# Patient Record
Sex: Female | Born: 1950 | Race: White | Hispanic: No | Marital: Married | Smoking: Never smoker
Health system: Southern US, Community
[De-identification: ages and names within clinical notes are randomized; demographics above are authoritative.]

## PROBLEM LIST (undated history)

## (undated) ENCOUNTER — Ambulatory Visit: Payer: Medicare Other

## (undated) HISTORY — PX: TONSILLECTOMY: SUR1361

---

## 2005-02-13 ENCOUNTER — Ambulatory Visit: Payer: Self-pay | Admitting: Internal Medicine

## 2006-02-27 ENCOUNTER — Ambulatory Visit: Payer: Self-pay | Admitting: Internal Medicine

## 2006-08-14 DIAGNOSIS — D239 Other benign neoplasm of skin, unspecified: Secondary | ICD-10-CM

## 2006-08-14 HISTORY — DX: Other benign neoplasm of skin, unspecified: D23.9

## 2007-04-03 ENCOUNTER — Ambulatory Visit: Payer: Self-pay | Admitting: Internal Medicine

## 2008-04-07 ENCOUNTER — Ambulatory Visit: Payer: Self-pay | Admitting: Internal Medicine

## 2009-06-07 ENCOUNTER — Ambulatory Visit: Payer: Self-pay | Admitting: Internal Medicine

## 2009-11-14 ENCOUNTER — Ambulatory Visit: Payer: Self-pay | Admitting: Unknown Physician Specialty

## 2011-03-19 ENCOUNTER — Ambulatory Visit: Payer: Self-pay | Admitting: Internal Medicine

## 2012-03-19 ENCOUNTER — Ambulatory Visit: Payer: Self-pay | Admitting: Internal Medicine

## 2013-07-08 ENCOUNTER — Ambulatory Visit: Payer: Self-pay | Admitting: Family Medicine

## 2014-06-18 ENCOUNTER — Other Ambulatory Visit: Payer: Self-pay | Admitting: Family Medicine

## 2014-06-18 DIAGNOSIS — Z1231 Encounter for screening mammogram for malignant neoplasm of breast: Secondary | ICD-10-CM

## 2014-07-27 ENCOUNTER — Ambulatory Visit
Admission: RE | Admit: 2014-07-27 | Discharge: 2014-07-27 | Disposition: A | Discharge status: Home / Self Care | Payer: 59 | Source: Ambulatory Visit | Attending: Family Medicine | Admitting: Family Medicine

## 2014-07-27 DIAGNOSIS — Z1231 Encounter for screening mammogram for malignant neoplasm of breast: Secondary | ICD-10-CM | POA: Diagnosis not present

## 2015-09-15 ENCOUNTER — Other Ambulatory Visit: Payer: Self-pay | Admitting: Family Medicine

## 2015-09-15 DIAGNOSIS — Z1231 Encounter for screening mammogram for malignant neoplasm of breast: Secondary | ICD-10-CM

## 2015-10-03 ENCOUNTER — Ambulatory Visit
Admission: RE | Admit: 2015-10-03 | Discharge: 2015-10-03 | Disposition: A | Discharge status: Home / Self Care | Payer: No Typology Code available for payment source | Source: Ambulatory Visit | Attending: Family Medicine | Admitting: Family Medicine

## 2015-10-03 DIAGNOSIS — R928 Other abnormal and inconclusive findings on diagnostic imaging of breast: Secondary | ICD-10-CM | POA: Insufficient documentation

## 2015-10-03 DIAGNOSIS — Z1231 Encounter for screening mammogram for malignant neoplasm of breast: Secondary | ICD-10-CM | POA: Insufficient documentation

## 2015-10-04 ENCOUNTER — Other Ambulatory Visit: Payer: Self-pay | Admitting: Family Medicine

## 2015-10-04 DIAGNOSIS — N6489 Other specified disorders of breast: Secondary | ICD-10-CM

## 2015-10-10 ENCOUNTER — Ambulatory Visit
Admission: RE | Admit: 2015-10-10 | Discharge: 2015-10-10 | Disposition: A | Discharge status: Home / Self Care | Payer: No Typology Code available for payment source | Source: Ambulatory Visit | Attending: Family Medicine | Admitting: Family Medicine

## 2015-10-10 DIAGNOSIS — N6489 Other specified disorders of breast: Secondary | ICD-10-CM | POA: Insufficient documentation

## 2017-05-14 ENCOUNTER — Other Ambulatory Visit: Payer: Self-pay | Admitting: Family Medicine

## 2017-05-14 DIAGNOSIS — Z1231 Encounter for screening mammogram for malignant neoplasm of breast: Secondary | ICD-10-CM

## 2017-05-27 ENCOUNTER — Ambulatory Visit
Admission: RE | Admit: 2017-05-27 | Discharge: 2017-05-27 | Disposition: A | Discharge status: Home / Self Care | Payer: Medicare Other | Source: Ambulatory Visit | Attending: Family Medicine | Admitting: Family Medicine

## 2017-05-27 ENCOUNTER — Other Ambulatory Visit: Payer: Self-pay | Admitting: Family Medicine

## 2017-05-27 DIAGNOSIS — Z1231 Encounter for screening mammogram for malignant neoplasm of breast: Secondary | ICD-10-CM | POA: Diagnosis present

## 2017-06-13 IMAGING — US US BREAST*L* LIMITED INC AXILLA
1 series · 4 of 4 positions shown · non-contrast
Comparison: Previous exams including recent screening mammogram
dated 10/03/2015.

CLINICAL DATA: Patient returns today to evaluate a possible left
breast asymmetry identified on recent screening mammogram.

EXAM:
2D DIGITAL DIAGNOSTIC LEFT MAMMOGRAM WITH CAD AND ADJUNCT TOMO
ULTRASOUND LEFT BREAST

[Series 1: us breast*left* limited inc axilla · 0.07mm/px · 4 of 4 slices shown]
[im 1/4]
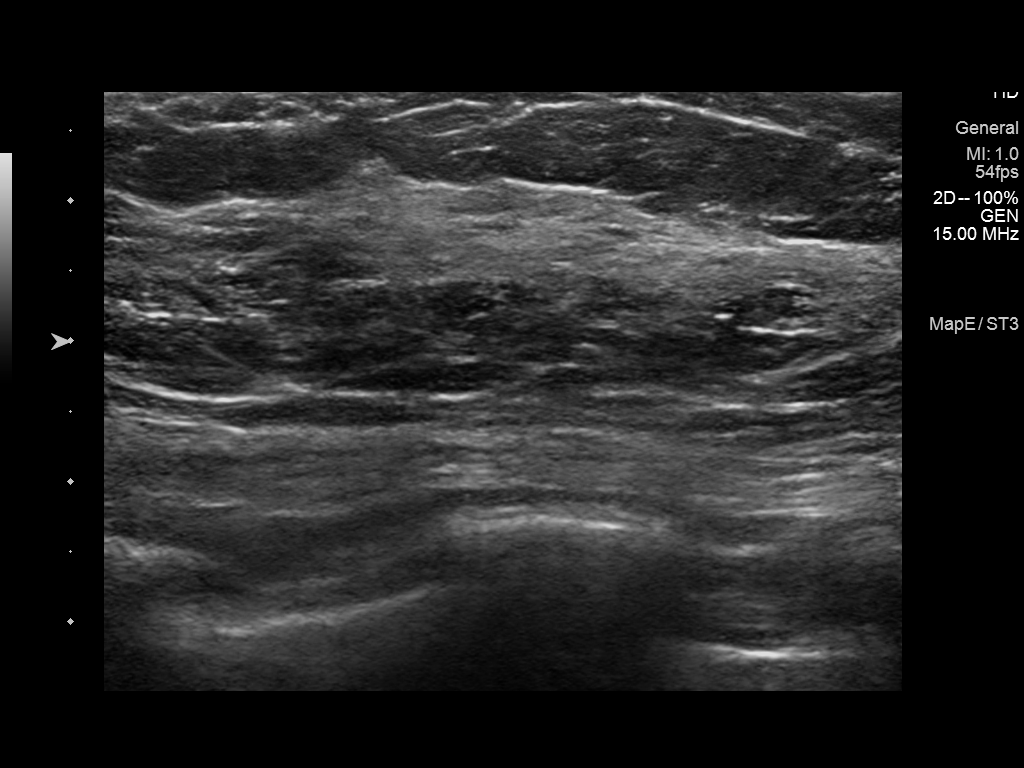
[im 2/4]
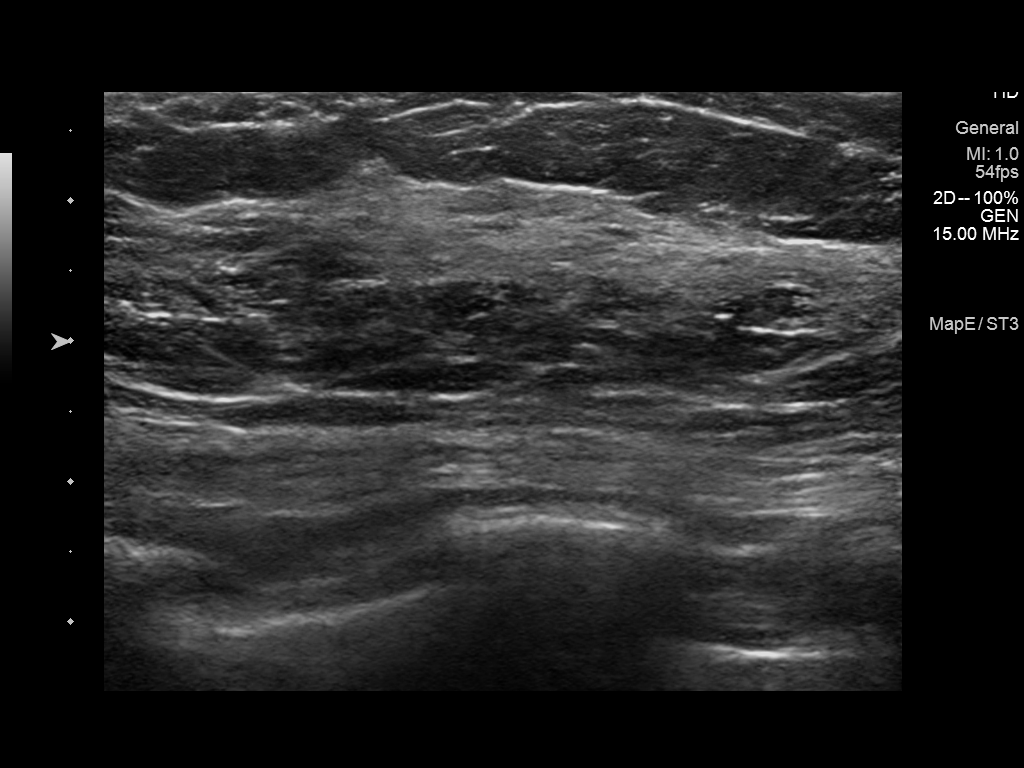
[im 3/4]
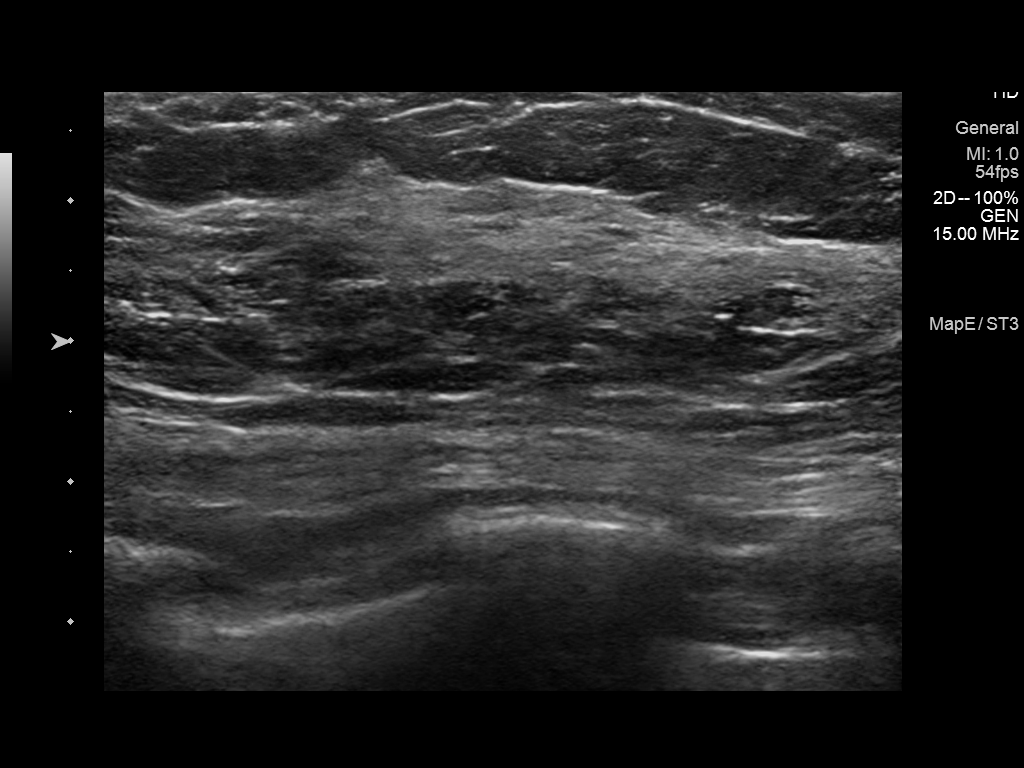
[im 4/4]
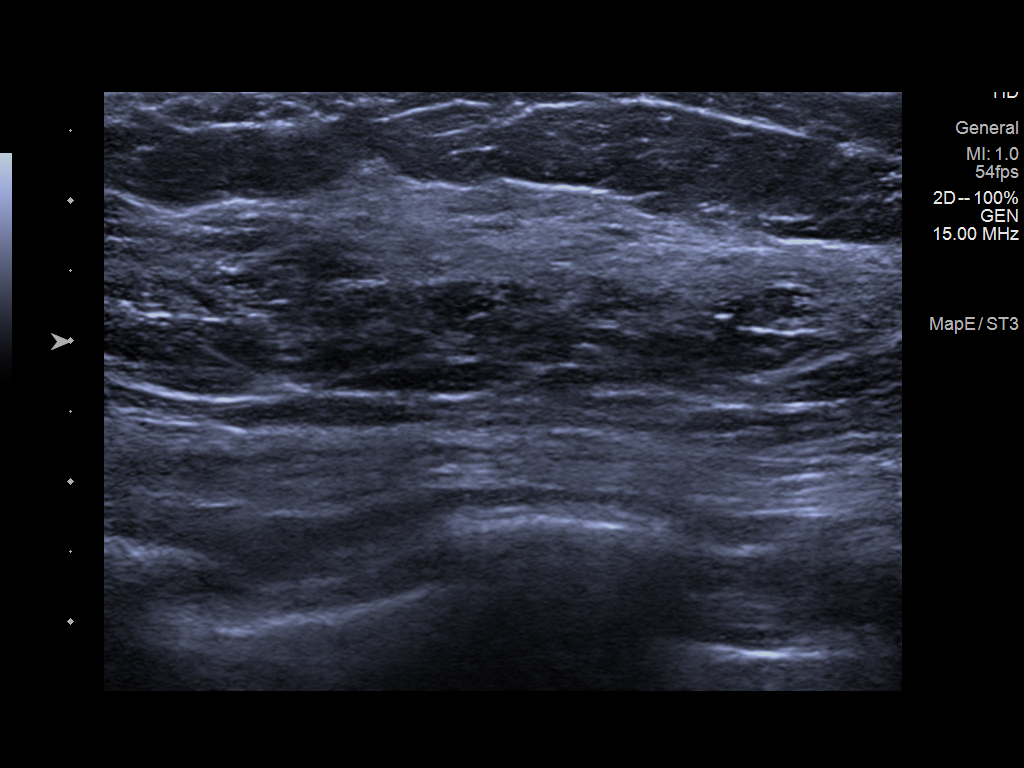

[4 of 4 positions shown; findings below may reference images not displayed]

ACR Breast Density Category b: There are scattered areas of
fibroglandular density.
FINDINGS: On today's additional views with spot compression and 3D
tomosynthesis, the questioned asymmetry in the upper left breast is
most suggestive of superimposition of normal dense fibroglandular
tissues, particularly on the true lateral view. There are no
circumscribed masses, suspicious calcifications or secondary signs
of malignancy identified within the left breast on today's exam.

Mammographic images were processed with CAD.

On physical exam, there is no palpable abnormality identified within
the upper left breast.

Targeted ultrasound is performed, evaluating the upper left breast
from [DATE] to [DATE] axes, showing only normal fibroglandular tissues
and fat lobules throughout. No suspicious solid or cystic masses are
seen by ultrasound. There is a ridge of normal dense fibroglandular
tissue within the left breast at the 1 o'clock axis, corresponding
to the mammographic appearance.
IMPRESSION: No evidence of malignancy within the left breast. Ridge of normal
dense fibroglandular tissue at the 1 o'clock axis of the left breast
corresponding to the mammographic appearance.

Patient may return to routine annual bilateral screening mammogram
schedule.

RECOMMENDATION:
Screening mammogram in one year.(Code:NP-5-5EC)

I have discussed the findings and recommendations with the patient.
Results were also provided in writing at the conclusion of the
visit. If applicable, a reminder letter will be sent to the patient
regarding the next appointment.

BI-RADS CATEGORY  1: Negative.

## 2019-06-02 ENCOUNTER — Other Ambulatory Visit: Payer: Self-pay

## 2019-06-02 ENCOUNTER — Ambulatory Visit (INDEPENDENT_AMBULATORY_CARE_PROVIDER_SITE_OTHER): Payer: Medicare Other | Admitting: Dermatology

## 2019-06-02 DIAGNOSIS — L578 Other skin changes due to chronic exposure to nonionizing radiation: Secondary | ICD-10-CM | POA: Diagnosis not present

## 2019-06-02 DIAGNOSIS — Z1283 Encounter for screening for malignant neoplasm of skin: Secondary | ICD-10-CM

## 2019-06-02 DIAGNOSIS — Z86018 Personal history of other benign neoplasm: Secondary | ICD-10-CM

## 2019-06-02 DIAGNOSIS — D1801 Hemangioma of skin and subcutaneous tissue: Secondary | ICD-10-CM

## 2019-06-02 DIAGNOSIS — L821 Other seborrheic keratosis: Secondary | ICD-10-CM | POA: Diagnosis not present

## 2019-06-02 DIAGNOSIS — D229 Melanocytic nevi, unspecified: Secondary | ICD-10-CM

## 2019-06-02 DIAGNOSIS — L738 Other specified follicular disorders: Secondary | ICD-10-CM | POA: Diagnosis not present

## 2019-06-02 DIAGNOSIS — D225 Melanocytic nevi of trunk: Secondary | ICD-10-CM

## 2019-06-02 DIAGNOSIS — L988 Other specified disorders of the skin and subcutaneous tissue: Secondary | ICD-10-CM

## 2019-06-02 NOTE — Progress Notes (Signed)
   Follow-Up Visit   Subjective  Mary Bennett is a 69 y.o. female who presents for the following: Annual Exam (Spots, some may have changed).  Nothing bothering her.  No concerns.  The following portions of the chart were reviewed this encounter and updated as appropriate:    Review of Systems: No other skin or systemic complaints.  Objective  Well appearing patient in no apparent distress; mood and affect are within normal limits.  A full examination was performed including scalp, head, eyes, ears, nose, lips, neck, chest, axillae, abdomen, back, buttocks, bilateral upper extremities, bilateral lower extremities, hands, feet, fingers, toes, fingernails, and toenails. All findings within normal limits unless otherwise noted below.  Objective  face: Rhytides and volume loss.   Objective  L lower medial leg, R mid back: Scar with no evidence of recurrence.   Objective  L chest: 6mm medium dark brown papule  Objective  Left Abdomen (side) - Lower: Stuck-on, waxy, tan-brown papules and plaques -- Discussed benign etiology and prognosis.   Assessment & Plan    Skin cancer screening performed today. Actinic Damage - diffuse scaly erythematous macules with underlying dyspigmentation - Recommend daily broad spectrum sunscreen SPF 30+ to sun-exposed areas, reapply every 2 hours as needed.  - Call for new or changing lesions. Melanocytic Nevi - Tan-brown and/or pink-flesh-colored symmetric macules and papules - Benign appearing on exam today - Observation - Call clinic for new or changing moles - Recommend daily use of broad spectrum spf 30+ sunscreen to sun-exposed areas.  Sebaceous Hyperplasia - Small yellow papules with a central dell - Benign - Observe Hemangiomas - Red papules - Discussed benign nature - Observe - Call for any changes  Elastosis of skin face  Discussed Botox vs Fillers vs Laser vs Retinoids  Recommend daily broad spectrum sunscreen SPF 30+ to  sun-exposed areas, reapply every 2 hours as needed. Call for new or changing lesions.   History of dysplastic nevus L lower medial leg, R mid back  No evidence of recurrence, call clinic for new or changing lesions.   Nevus L chest  Benign-appearing.  Observation.  Call clinic for new or changing moles.  Recommend daily use of broad spectrum spf 30+ sunscreen to sun-exposed areas.    Seborrheic keratosis Left Abdomen (side) - Lower  Benign, observe.    Return in about 1 year (around 06/01/2020) for TBSE.   Graciella Belton, RMA, am acting as scribe for Brendolyn Patty, MD .

## 2019-06-02 NOTE — Patient Instructions (Signed)
Recommend daily broad spectrum sunscreen SPF 30+ to sun-exposed areas, reapply every 2 hours as needed. Call for new or changing lesions.  

## 2019-09-09 ENCOUNTER — Other Ambulatory Visit: Payer: Self-pay | Admitting: Family Medicine

## 2020-01-25 ENCOUNTER — Other Ambulatory Visit: Payer: Self-pay | Admitting: Family Medicine

## 2020-01-25 DIAGNOSIS — Z1231 Encounter for screening mammogram for malignant neoplasm of breast: Secondary | ICD-10-CM

## 2020-03-18 ENCOUNTER — Other Ambulatory Visit: Payer: Self-pay

## 2020-03-18 ENCOUNTER — Ambulatory Visit
Admission: RE | Admit: 2020-03-18 | Discharge: 2020-03-18 | Disposition: A | Discharge status: Home / Self Care | Payer: Medicare Other | Source: Ambulatory Visit | Attending: Family Medicine | Admitting: Family Medicine

## 2020-03-18 DIAGNOSIS — Z1231 Encounter for screening mammogram for malignant neoplasm of breast: Secondary | ICD-10-CM | POA: Diagnosis not present

## 2020-06-06 ENCOUNTER — Other Ambulatory Visit: Payer: Self-pay

## 2020-06-06 ENCOUNTER — Encounter: Payer: Self-pay | Admitting: Dermatology

## 2020-06-06 ENCOUNTER — Ambulatory Visit (INDEPENDENT_AMBULATORY_CARE_PROVIDER_SITE_OTHER): Payer: Medicare Other | Admitting: Dermatology

## 2020-06-06 DIAGNOSIS — Z86018 Personal history of other benign neoplasm: Secondary | ICD-10-CM | POA: Diagnosis not present

## 2020-06-06 DIAGNOSIS — D229 Melanocytic nevi, unspecified: Secondary | ICD-10-CM

## 2020-06-06 DIAGNOSIS — Z1283 Encounter for screening for malignant neoplasm of skin: Secondary | ICD-10-CM | POA: Diagnosis not present

## 2020-06-06 DIAGNOSIS — D225 Melanocytic nevi of trunk: Secondary | ICD-10-CM

## 2020-06-06 DIAGNOSIS — L814 Other melanin hyperpigmentation: Secondary | ICD-10-CM

## 2020-06-06 DIAGNOSIS — L578 Other skin changes due to chronic exposure to nonionizing radiation: Secondary | ICD-10-CM | POA: Diagnosis not present

## 2020-06-06 DIAGNOSIS — D2239 Melanocytic nevi of other parts of face: Secondary | ICD-10-CM

## 2020-06-06 DIAGNOSIS — D1800 Hemangioma unspecified site: Secondary | ICD-10-CM

## 2020-06-06 DIAGNOSIS — L821 Other seborrheic keratosis: Secondary | ICD-10-CM

## 2020-06-06 NOTE — Progress Notes (Signed)
   Follow-Up Visit   Subjective  Mary Bennett is a 70 y.o. female who presents for the following: Annual Exam (Patient here for TBSE. She has a new growth on her left lower abdomen. No symptoms noted. History of dysplastic nevi of the left lower medial leg, right mid back, and left mid back. ).   The following portions of the chart were reviewed this encounter and updated as appropriate:       Review of Systems:  No other skin or systemic complaints except as noted in HPI or Assessment and Plan.  Objective  Well appearing patient in no apparent distress; mood and affect are within normal limits.  A full examination was performed including scalp, head, eyes, ears, nose, lips, neck, chest, axillae, abdomen, back, buttocks, bilateral upper extremities, bilateral lower extremities, hands, feet, fingers, toes, fingernails, and toenails. All findings within normal limits unless otherwise noted below.  Objective  Left chest: 6.74mm fleshy medium dark brown papule  Right Temple: 3.16mm brown macule, darker central   Assessment & Plan   Skin cancer screening performed today.  Actinic Damage - chronic, secondary to cumulative UV radiation exposure/sun exposure over time - diffuse scaly erythematous macules with underlying dyspigmentation - Recommend daily broad spectrum sunscreen SPF 30+ to sun-exposed areas, reapply every 2 hours as needed.  - Recommend staying in the shade or wearing long sleeves, sun glasses (UVA+UVB protection) and wide brim hats (4-inch brim around the entire circumference of the hat). - Call for new or changing lesions.  Lentigines - Scattered tan macules - Due to sun exposure - Benign-appering, observe - Recommend daily broad spectrum sunscreen SPF 30+ to sun-exposed areas, reapply every 2 hours as needed. - Call for any changes  Melanocytic Nevi - Tan-brown and/or pink-flesh-colored symmetric macules and papules - Benign appearing on exam today -  Observation - Call clinic for new or changing moles - Recommend daily use of broad spectrum spf 30+ sunscreen to sun-exposed areas.   Seborrheic Keratoses - Stuck-on, waxy, tan-brown papules and/or plaques, including left lower abdomen  - Benign-appearing - Discussed benign etiology and prognosis. - Observe - Call for any changes  History of Dysplastic Nevi - No evidence of recurrence today - Recommend regular full body skin exams - Recommend daily broad spectrum sunscreen SPF 30+ to sun-exposed areas, reapply every 2 hours as needed.  - Call if any new or changing lesions are noted between office visits  Hemangiomas - Red papules - Discussed benign nature - Observe - Call for any changes  Nevus (2) Left chest; Right Temple  Benign-appearing.  Observation.  Call clinic for new or changing moles.  Recommend daily use of broad spectrum spf 30+ sunscreen to sun-exposed areas.    Return in about 1 year (around 06/06/2021) for TBSE.   IJamesetta Orleans, CMA, am acting as scribe for Brendolyn Patty, MD .  Documentation: I have reviewed the above documentation for accuracy and completeness, and I agree with the above.  Brendolyn Patty MD

## 2020-06-06 NOTE — Patient Instructions (Addendum)
If you have any questions or concerns for your doctor, please call our main line at [phone] and press option 4 to reach your doctor's medical assistant. If no one answers, please leave a voicemail as directed and we will return your call as soon as possible. Messages left after 4 pm will be answered the following business day.   You may also send [place] a message via [place]. We typically respond to MyChart messages within 1-2 business days.  For prescription refills, please ask your pharmacy to contact our office. Our fax number is [phone].  If you have an urgent issue when the clinic is closed that cannot wait until the next business day, you can page your doctor at the number below.    Please note that while we do our best to be available for urgent issues outside of office hours, we are not available 24/7.   If you have an urgent issue and are unable to reach [place], you may choose to seek medical care at your doctor's office, retail clinic, urgent care center, or emergency room.  If you have a medical emergency, please immediately call 911 or go to the emergency department.  Pager Numbers  - Dr. Nehemiah Massed: [phone]  - Dr. Laurence Ferrari: [phone]  - Dr. Nicole Kindred: [phone]  In the event of inclement weather, please call our main line at [phone] for an update on the status of any delays or closures.  Dermatology Medication Tips: Please keep the boxes that topical medications come in in order to help keep track of the instructions about where and how to use these. Pharmacies typically print the medication instructions only on the boxes and not directly on the medication tubes.   If your medication is too expensive, please contact our office at [phone] option 4 or send [place] a message through [place].   We are unable to tell what your co-pay for medications will be in advance as this is different depending on your insurance coverage. However, we may be able to find a substitute  medication at lower cost or fill out paperwork to get insurance to cover a needed medication.   If a prior authorization is required to get your medication covered by your insurance company, please allow [place] 1-2 business days to complete this process.  Drug prices often vary depending on where the prescription is filled and some pharmacies may offer cheaper prices.  The website www.goodrx.com contains coupons for medications through different pharmacies. The prices here do not account for what the cost may be with help from insurance (it may be cheaper with your insurance), but the website can give you the price if you did not use any insurance.  - You can print the associated coupon and take it with your prescription to the pharmacy.  - You may also stop by our office during regular business hours and pick up a GoodRx coupon card.  - If you need your prescription sent electronically to a different pharmacy, notify our office through [facility] or by phone at [phone] option 4.   Melanoma ABCDEs  Melanoma is the most dangerous type of skin cancer, and is the leading cause of death from skin disease.  You are more likely to develop melanoma if you:  Have light-colored skin, light-colored eyes, or red or blond hair  Spend a lot of time in the sun  Tan regularly, either outdoors or in a tanning bed  Have had blistering sunburns, especially during childhood  Have a close  family member who has had a melanoma  Have atypical moles or large birthmarks  Early detection of melanoma is key since treatment is typically straightforward and cure rates are extremely high if we catch it early.   The first sign of melanoma is often a change in a mole or a new dark spot.  The ABCDE system is a way of remembering the signs of melanoma.  A for asymmetry:  The two halves do not match. B for border:  The edges of the growth are irregular. C for color:  A mixture of colors are present instead  of an even brown color. D for diameter:  Melanomas are usually (but not always) greater than 36mm - the size of a pencil eraser. E for evolution:  The spot keeps changing in size, shape, and color.  Please check your skin once per month between visits. You can use a small mirror in front and a large mirror behind you to keep an eye on the back side or your body.   If you see any new or changing lesions before your next follow-up, please call to schedule a visit.  Please continue daily skin protection including broad spectrum sunscreen SPF 30+ to sun-exposed areas, reapplying every 2 hours as needed when you're outdoors.   Staying in the shade or wearing long sleeves, sun glasses (UVA+UVB protection) and wide brim hats (4-inch brim around the entire circumference of the hat) are also recommended for sun protection.

## 2021-04-18 ENCOUNTER — Other Ambulatory Visit: Payer: Self-pay | Admitting: Family Medicine

## 2021-04-18 DIAGNOSIS — Z1231 Encounter for screening mammogram for malignant neoplasm of breast: Secondary | ICD-10-CM

## 2021-04-21 ENCOUNTER — Ambulatory Visit
Admission: RE | Admit: 2021-04-21 | Discharge: 2021-04-21 | Disposition: A | Discharge status: Home / Self Care | Payer: Medicare Other | Source: Ambulatory Visit | Attending: Family Medicine | Admitting: Family Medicine

## 2021-04-21 ENCOUNTER — Other Ambulatory Visit: Payer: Self-pay

## 2021-04-21 DIAGNOSIS — Z1231 Encounter for screening mammogram for malignant neoplasm of breast: Secondary | ICD-10-CM | POA: Diagnosis present

## 2021-06-19 ENCOUNTER — Ambulatory Visit (INDEPENDENT_AMBULATORY_CARE_PROVIDER_SITE_OTHER): Payer: Medicare Other | Admitting: Dermatology

## 2021-06-19 DIAGNOSIS — D225 Melanocytic nevi of trunk: Secondary | ICD-10-CM | POA: Diagnosis not present

## 2021-06-19 DIAGNOSIS — D229 Melanocytic nevi, unspecified: Secondary | ICD-10-CM

## 2021-06-19 DIAGNOSIS — Z1283 Encounter for screening for malignant neoplasm of skin: Secondary | ICD-10-CM

## 2021-06-19 DIAGNOSIS — L578 Other skin changes due to chronic exposure to nonionizing radiation: Secondary | ICD-10-CM

## 2021-06-19 DIAGNOSIS — D1800 Hemangioma unspecified site: Secondary | ICD-10-CM

## 2021-06-19 DIAGNOSIS — D2239 Melanocytic nevi of other parts of face: Secondary | ICD-10-CM | POA: Diagnosis not present

## 2021-06-19 DIAGNOSIS — L821 Other seborrheic keratosis: Secondary | ICD-10-CM

## 2021-06-19 DIAGNOSIS — L304 Erythema intertrigo: Secondary | ICD-10-CM

## 2021-06-19 DIAGNOSIS — Z86018 Personal history of other benign neoplasm: Secondary | ICD-10-CM

## 2021-06-19 DIAGNOSIS — L814 Other melanin hyperpigmentation: Secondary | ICD-10-CM

## 2021-06-19 MED ORDER — KETOCONAZOLE 2 % EX CREA: 1.0000 "application " | TOPICAL_CREAM | Freq: Two times a day (BID) | CUTANEOUS | 2 refills | Status: DC

## 2021-06-19 NOTE — Patient Instructions (Addendum)
Start over the [employer] powder apply to affected skin after shower/bath ?Start over the counter plain Vaseline apply to affected skin at bedtime  ? ? ?If You Need Anything After Your Visit ? ?If you have any questions or concerns for your doctor, please call our main line at [phone] and press option 4 to reach your doctor's medical assistant. If no one answers, please leave a voicemail as directed and we will return your call as soon as possible. Messages left after 4 pm will be answered the following business day.  ? ?You may also send [place] a message via MyChart. We typically respond to MyChart messages within 1-2 business days. ? ?For prescription refills, please ask your pharmacy to contact our office. Our fax number is [phone]. ? ?If you have an urgent issue when the clinic is closed that cannot wait until the next business day, you can page your doctor at the number below.   ? ?Please note that while we do our best to be available for urgent issues outside of office hours, we are not available 24/7.  ? ?If you have an urgent issue and are unable to reach [place], you may choose to seek medical care at your doctor's office, retail clinic, urgent care center, or emergency room. ? ?If you have a medical emergency, please immediately call 911 or go to the emergency department. ? ?Pager Numbers ? ?- Dr. Nehemiah Massed: [phone] ? ?- Dr. Laurence Ferrari: [phone] ? ?- Dr. Nicole Kindred: [phone] ? ?In the event of inclement weather, please call our main line at [phone] for an update on the status of any delays or closures. ? ?Dermatology Medication Tips: ?Please keep the boxes that topical medications come in in order to help keep track of the instructions about where and how to use these. Pharmacies typically print the medication instructions only on the boxes and not directly on the medication tubes.  ? ?If your medication is too expensive, please contact our office at [phone] option 4 or send [place] a  message through [place].  ? ?We are unable to tell what your co-pay for medications will be in advance as this is different depending on your insurance coverage. However, we may be able to find a substitute medication at lower cost or fill out paperwork to get insurance to cover a needed medication.  ? ?If a prior authorization is required to get your medication covered by your insurance company, please allow [place] 1-2 business days to complete this process. ? ?Drug prices often vary depending on where the prescription is filled and some pharmacies may offer cheaper prices. ? ?The website www.goodrx.com contains coupons for medications through different pharmacies. The prices here do not account for what the cost may be with help from insurance (it may be cheaper with your insurance), but the website can give you the price if you did not use any insurance.  ?- You can print the associated coupon and take it with your prescription to the pharmacy.  ?- You may also stop by our office during regular business hours and pick up a GoodRx coupon card.  ?- If you need your prescription sent electronically to a different pharmacy, notify our office through [facility] or by phone at [phone] option 4. ? ? ? ? ?Si Usted Necesita Algo Despu?s de Su Visita ? ?Tambi?n puede enviarnos un mensaje a trav?s de MyChart. Por lo general respondemos a los mensajes de MyChart en el transcurso de 1 a 2 d?as h?biles. ? ?  Para renovar recetas, por favor pida a su farmacia que se ponga en contacto con nuestra oficina. Nuestro n?mero de fax es el [phone]. ? ?Si tiene un asunto urgente cuando la cl?nica est? cerrada y que no puede esperar hasta el siguiente d?a h?bil, puede llamar/localizar a su doctor(a) al n?mero que aparece a continuaci?n.  ? ?Por favor, tenga en cuenta que aunque hacemos todo lo posible para estar disponibles para asuntos urgentes fuera del horario de oficina, no estamos disponibles las 24 horas del d?a, los 7  d?as de la semana.  ? ?Si tiene un problema urgente y no puede comunicarse con nosotros, puede optar por buscar atenci?n m?dica  en el consultorio de su doctor(a), en una cl?nica privada, en un centro de atenci?n urgente o en una sala de emergencias. ? ?Si tiene Engineer, maintenance (IT) m?dica, por favor llame inmediatamente al 911 o vaya a la sala de emergencias. ? ?N?meros de b?per ? ?- Dr. Nehemiah Massed: [phone] ? ?- Dra. Moye: [phone] ? ?- Dra. Nicole Kindred: [phone] ? ?En caso de inclemencias del tiempo, por favor llame a nuestra l?nea principal al [phone] para una actualizaci?n sobre el estado de cualquier retraso o cierre. ? ?Consejos para la medicaci?n en dermatolog?a: ?Por favor, guarde las cajas en las que vienen los medicamentos de uso t?pico para ayudarle a seguir las instrucciones sobre d?nde y c?mo usarlos. Las farmacias generalmente imprimen las instrucciones del medicamento s?lo en las cajas y no directamente en los tubos del [place].  ? ?Si su medicamento es muy caro, por favor, p?ngase en contacto con Zigmund Daniel llamando al [phone] y presione la opci?n 4 o env?enos un mensaje a trav?s de MyChart.  ? ?No podemos decirle cu?l ser? su copago por los medicamentos por adelantado ya que esto es diferente dependiendo de la cobertura de su seguro. Sin embargo, es posible que podamos encontrar un medicamento sustituto a Electrical engineer un formulario para que el seguro cubra el medicamento que se considera necesario.  ? ?Si se requiere Ardelia Mems autorizaci?n previa para que su compa??a de seguros [place] su medicamento, por favor perm?tanos de 1 a 2 d?as h?biles para completar este proceso. ? ?Los precios de los medicamentos var?an con frecuencia dependiendo del Environmental consultant de d?nde se surte la receta y alguna farmacias pueden ofrecer precios m?s baratos. ? ?El sitio web www.goodrx.com tiene cupones para medicamentos de Airline pilot. Los precios aqu? no tienen en cuenta lo que podr?a costar con  la ayuda del seguro (puede ser m?s barato con su seguro), pero el sitio web puede darle el precio si no utiliz? ning?n seguro.  ?- Puede imprimir el cup?n correspondiente y llevarlo con su receta a la farmacia.  ?- Tambi?n puede pasar por nuestra oficina durante el horario de atenci?n regular y recoger una tarjeta de cupones de GoodRx.  ?- Si necesita que su receta se env?e electr?nicamente a Chiropodist, informe a nuestra oficina a trav?s de MyChart de [place] o por tel?fono llamando al [phone] y presione la opci?n 4.  ?

## 2021-06-19 NOTE — Progress Notes (Signed)
? ?Follow-Up Visit ?  ?Subjective  ?Mary Bennett is a 71 y.o. female who presents for the following: Annual Exam. Yearly mole check, hx of Dysplastic nevus. The patient presents for Total-Body Skin Exam (TBSE) for skin cancer screening and mole check.  The patient has spots, moles and lesions to be evaluated, some may be new or changing and the patient has concerns that these could be cancer. Nothing is bothering her or changing that she knows of.  She had a perirectal rash that has improved on ketoconazole cream. ? ? ? ?The following portions of the chart were reviewed this encounter and updated as appropriate:  ?  ?  ? ?Review of Systems:  No other skin or systemic complaints except as noted in HPI or Assessment and Plan. ? ?Objective  ?Well appearing patient in no apparent distress; mood and affect are within normal limits. ? ?A full examination was performed including scalp, head, eyes, ears, nose, lips, neck, chest, axillae, abdomen, back, buttocks, bilateral upper extremities, bilateral lower extremities, hands, feet, fingers, toes, fingernails, and toenails. All findings within normal limits unless otherwise noted below. ? ?perirectal ?Light pink xerotic patch of the perirectal area R>L ? ?left chest, left mid sternum, left spinal mid back, right temple ?Left chest: ?6.13m fleshy medium dark brown papule ?  ?Right Temple: ?3.017mbrown macule, darker central ? ?Left spinal mid back ?4.0 x 3.99m16mrown macule with a red macule adjacent, slightly irregular border ? ?Left sternum  ?3.5mm36meckled brown macule, no changes per pt ? ? ? 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 Improving but not to goal.  ? ?Cont Ketoconazole 2% cream apply to affected skin bid prn flares  ?Start over the counter plain Vaseline apply to affected skin at bedtime  ?Start over the counter Ze[employer] AF powder after  shower daily in AM ? ?Intertrigo is a chronic recurrent rash that occurs in skin fold areas that may be associated with friction; heat; moisture; yeast; fungus; and bacteria.  It is exacerbated by increased movement / activity; sweating; and higher atmospheric temperature.  ? ? ? ?Related Medications ?ketoconazole (NIZORAL) 2 % cream ?Apply 1 application. topically 2 (two) times daily. Apply to affected skin prn flares ? ?Nevus (4) ?right temple; left chest; left mid sternum; left spinal mid back ? ?Benign-appearing.  Observation.  Call clinic for new or changing moles.  Recommend daily use of broad spectrum spf 30+ sunscreen to sun-exposed areas.   ? ? ?Lentigines ?- Scattered tan macules ?- Due to sun exposure ?- Benign-appearing, observe ?- Recommend daily broad spectrum sunscreen SPF 30+ to sun-exposed areas, reapply every 2 hours as needed. ?- Call for any changes ? ?Seborrheic Keratoses ?- Stuck-on, waxy, tan-brown papules and/or plaques  ?- Benign-appearing ?- Discussed benign etiology and prognosis. ?- Observe ?- Call for any changes ? ?Melanocytic Nevi ?- Tan-brown and/or pink-flesh-colored symmetric macules and papules ?- Benign appearing on exam today ?- Observation ?- Call clinic for new or changing moles ?- Recommend daily use of broad spectrum spf 30+ sunscreen to sun-exposed areas.  ? ?Hemangiomas ?- Red papules ?- Discussed benign nature ?- Observe ?- Call for any changes ? ?Actinic Damage ?- Chronic condition, secondary to cumulative UV/sun exposure ?- diffuse scaly erythematous macules with underlying dyspigmentation ?- Recommend daily broad spectrum sunscreen SPF 30+ to sun-exposed areas, reapply every 2 hours as needed.  ?- Staying in  the shade or wearing long sleeves, sun glasses (UVA+UVB protection) and wide brim hats (4-inch brim around the entire circumference of the hat) are also recommended for sun protection.  ?- Call for new or changing lesions. ? ?History of Dysplastic Nevi ?- No  evidence of recurrence today ?- Recommend regular full body skin exams ?- Recommend daily broad spectrum sunscreen SPF 30+ to sun-exposed areas, reapply every 2 hours as needed.  ?- Call if any new or changing lesions are noted between office visits  ? ?Skin cancer screening performed today.  ? ? ?Return in about 1 year (around 06/20/2022) for TBSE, hx of Dysplastic nevus . ? ?I, Marye Round, CMA, am acting as scribe for Brendolyn Patty, MD .  ? ?Documentation: I have reviewed the above documentation for accuracy and completeness, and I agree with the above. ? ?Brendolyn Patty MD  ?

## 2021-11-07 ENCOUNTER — Encounter
Admission: RE | Disposition: A | Discharge status: Home / Self Care | Payer: Self-pay | Source: Ambulatory Visit | Attending: Gastroenterology

## 2021-11-07 ENCOUNTER — Ambulatory Visit: Payer: Medicare Other | Admitting: Anesthesiology

## 2021-11-07 ENCOUNTER — Encounter: Payer: Self-pay | Admitting: *Deleted

## 2021-11-07 ENCOUNTER — Other Ambulatory Visit: Payer: Self-pay

## 2021-11-07 ENCOUNTER — Ambulatory Visit
Admission: RE | Admit: 2021-11-07 | Discharge: 2021-11-07 | Disposition: A | Discharge status: Home / Self Care | Payer: Medicare Other | Source: Ambulatory Visit | Attending: Gastroenterology | Admitting: Gastroenterology

## 2021-11-07 DIAGNOSIS — Z1211 Encounter for screening for malignant neoplasm of colon: Secondary | ICD-10-CM | POA: Insufficient documentation

## 2021-11-07 DIAGNOSIS — K5730 Diverticulosis of large intestine without perforation or abscess without bleeding: Secondary | ICD-10-CM | POA: Diagnosis not present

## 2021-11-07 DIAGNOSIS — K640 First degree hemorrhoids: Secondary | ICD-10-CM | POA: Insufficient documentation

## 2021-11-07 HISTORY — PX: COLONOSCOPY WITH PROPOFOL: SHX5780

## 2021-11-07 SURGERY — COLONOSCOPY WITH PROPOFOL
Anesthesia: General

## 2021-11-07 MED ORDER — LIDOCAINE HCL (CARDIAC) PF 100 MG/5ML IV SOSY
PREFILLED_SYRINGE | INTRAVENOUS | Status: DC | PRN
  Administered 2021-11-07: 50 mg via INTRAVENOUS

## 2021-11-07 MED ORDER — PHENYLEPHRINE 80 MCG/ML (10ML) SYRINGE FOR IV PUSH (FOR BLOOD PRESSURE SUPPORT)
PREFILLED_SYRINGE | INTRAVENOUS | Status: DC | PRN
  Administered 2021-11-07: 80 ug via INTRAVENOUS

## 2021-11-07 MED ORDER — SODIUM CHLORIDE 0.9 % IV SOLN: INTRAVENOUS | Status: DC

## 2021-11-07 MED ORDER — PROPOFOL 500 MG/50ML IV EMUL
INTRAVENOUS | Status: DC | PRN
  Administered 2021-11-07: 165 ug/kg/min via INTRAVENOUS

## 2021-11-07 NOTE — Anesthesia Procedure Notes (Signed)
Date/Time: 11/07/2021 3:28 PM  Performed by: Loletha Grayer, CRNAPre-anesthesia Checklist: Patient identified, Emergency Drugs available, Patient being monitored, Suction available and Timeout performed Patient Re-evaluated:Patient Re-evaluated prior to induction Oxygen Delivery Method: Simple face mask

## 2021-11-07 NOTE — Op Note (Signed)
[facility] Patient Name: Mary Bennett Procedure Date: 11/07/2021 3:15 PM MRN: [id number] Account #: [id number] Date of Birth: 09-27-1950 Admit Type: Outpatient Age: 71 Room: [facility] ENDO ROOM 1 Gender: Female Note Status: Finalized Instrument Name: Jasper Riling [id number] Procedure:             Colonoscopy Indications:           Screening for colorectal malignant neoplasm Providers:             Andrey Farmer MD, MD Referring MD:          Caprice Renshaw MD (Referring MD) Medicines:             Monitored Anesthesia Care Complications:         No immediate complications. Procedure:             Pre-Anesthesia Assessment:                        - Prior to the procedure, a History and Physical was                         performed, and patient medications and allergies were                         reviewed. The patient is competent. The risks and                         benefits of the procedure and the sedation options and                         risks were discussed with the patient. All questions                         were answered and informed consent was obtained.                         Patient identification and proposed procedure were                         verified by the physician, the nurse, the                         anesthesiologist, the anesthetist and the technician                         in the endoscopy suite. Mental Status Examination:                         alert and oriented. Airway Examination: normal                         oropharyngeal airway and neck mobility. Respiratory                         Examination: clear to auscultation. CV Examination:                         normal. Prophylactic Antibiotics: The patient does not  require prophylactic antibiotics. Prior                         Anticoagulants: The patient has taken no previous                         anticoagulant or antiplatelet agents.  ASA Grade                         Assessment: II - A patient with mild systemic disease.                         After reviewing the risks and benefits, the patient                         was deemed in satisfactory condition to undergo the                         procedure. The anesthesia plan was to use monitored                         anesthesia care (MAC). Immediately prior to                         administration of medications, the patient was                         re-assessed for adequacy to receive sedatives. The                         heart rate, respiratory rate, oxygen saturations,                         blood pressure, adequacy of pulmonary ventilation, and                         response to care were monitored throughout the                         procedure. The physical status of the patient was                         re-assessed after the procedure.                        After obtaining informed consent, the colonoscope was                         passed under direct vision. Throughout the procedure,                         the patient's blood pressure, pulse, and oxygen                         saturations were monitored continuously. The                         Colonoscope was introduced through the anus and  advanced to the the cecum, identified by appendiceal                         orifice and ileocecal valve. The colonoscopy was                         somewhat difficult due to a redundant colon.                         Successful completion of the procedure was aided by                         applying abdominal pressure. The patient tolerated the                         procedure well. The quality of the bowel preparation                         was good. Findings:      The perianal and digital rectal examinations were normal.      A few small-mouthed diverticula were found in the sigmoid colon and       distal transverse colon.       Internal hemorrhoids were found during retroflexion. The hemorrhoids       were Grade I (internal hemorrhoids that do not prolapse).      The exam was otherwise without abnormality on direct and retroflexion       views. Impression:            - Diverticulosis in the sigmoid colon and in the                         distal transverse colon.                        - Internal hemorrhoids.                        - The examination was otherwise normal on direct and                         retroflexion views.                        - No specimens collected. Recommendation:        - Discharge patient to home.                        - Resume previous diet.                        - Continue present medications.                        - Repeat colonoscopy is not recommended due to current                         age (49 years or older) for screening purposes.                        - Return to referring physician as  previously                         scheduled. Procedure Code(s):     --- Professional ---                        O2703, Colorectal cancer screening; colonoscopy on                         individual not meeting criteria for high risk Diagnosis Code(s):     --- Professional ---                        Z12.11, Encounter for screening for malignant neoplasm                         of colon                        K64.0, First degree hemorrhoids                        K57.30, Diverticulosis of large intestine without                         perforation or abscess without bleeding CPT copyright 2019 [employer] Association. All rights reserved. The codes documented in this report are preliminary and upon coder review may  be revised to meet current compliance requirements. Andrey Farmer MD, MD 11/07/2021 3:53:53 PM Number of Addenda: 0 Note Initiated On: 11/07/2021 3:15 PM Scope Withdrawal Time: 0 hours 6 minutes 37 seconds  Total Procedure Duration: 0 hours 17 minutes 57 seconds   Estimated Blood Loss:  Estimated blood loss: none.      [facility]

## 2021-11-07 NOTE — H&P (Signed)
Outpatient short stay form Pre-procedure 11/07/2021  Mary Rubenstein, MD  Primary Physician: Derinda Late, MD  Reason for visit:  Screening colon  History of present illness:    71 y/o lady here for screening colonoscopy. Had normal one twelve years ago. No blood thinners. No significant abdominal surgeries. No family history of GI malignancies.    Current Facility-Administered Medications:    0.9 %  sodium chloride infusion, , Intravenous, Continuous, Jerrianne Hartin, Hilton Cork, MD, Last Rate: 20 mL/hr at 11/07/21 1515, Continued from Pre-op at 11/07/21 1515  No medications prior to admission.     No Known Allergies   Past Medical History:  Diagnosis Date   Dysplastic nevus 08/14/2006   R mid back - mild    Dysplastic nevus 08/14/2007   L lower med leg - mild    Dysplastic nevus 11/24/2008   L mid back - mild     Review of systems:  Otherwise negative.    Physical Exam  Gen: Alert, oriented. Appears stated age.  HEENT: PERRLA. Lungs: No respiratory distress CV: RRR Abd: soft, benign, no masses Ext: No edema    Planned procedures: Proceed with colonoscopy. The patient understands the nature of the planned procedure, indications, risks, alternatives and potential complications including but not limited to bleeding, infection, perforation, damage to internal organs and possible oversedation/side effects from anesthesia. The patient agrees and gives consent to proceed.  Please refer to procedure notes for findings, recommendations and patient disposition/instructions.     Mary Rubenstein, MD [facility]

## 2021-11-07 NOTE — Interval H&P Note (Signed)
History and Physical Interval Note:  11/07/2021 3:24 PM  Mary Bennett  has presented today for surgery, with the diagnosis of Z12.11 - Screening for colon cancer.  The various methods of treatment have been discussed with the patient and family. After consideration of risks, benefits and other options for treatment, the patient has consented to  Procedure(s): COLONOSCOPY WITH PROPOFOL (N/A) as a surgical intervention.  The patient's history has been reviewed, patient examined, no change in status, stable for surgery.  I have reviewed the patient's chart and labs.  Questions were answered to the patient's satisfaction.     Lesly Rubenstein  Ok to proceed with colonoscopy

## 2021-11-07 NOTE — Transfer of Care (Signed)
Immediate Anesthesia Transfer of Care Note  Patient: Mary Bennett  Procedure(s) Performed: COLONOSCOPY WITH PROPOFOL  Patient Location: Endoscopy Unit  Anesthesia Type:General  Level of Consciousness: awake  Airway & Oxygen Therapy: Patient Spontanous Breathing  Post-op Assessment: Report given to RN and Post -op Vital signs reviewed and stable  Post vital signs: Reviewed and stable  Last Vitals:  Vitals Value Taken Time  BP 101/62 11/07/21 1555  Temp 36.3 C 11/07/21 1555  Pulse 67 11/07/21 1556  Resp 18 11/07/21 1556  SpO2 97 % 11/07/21 1556    Last Pain:  Vitals:   11/07/21 1555  TempSrc: Temporal  PainSc: 0-No pain         Complications: No notable events documented.

## 2021-11-07 NOTE — Anesthesia Preprocedure Evaluation (Signed)
Anesthesia Evaluation  Patient identified by MRN, date of birth, ID band Patient awake    Reviewed: Allergy & Precautions, H&P , NPO status , Patient's Chart, lab work & pertinent test results, reviewed documented beta blocker date and time   Airway Mallampati: II   Neck ROM: full    Dental  (+) Poor Dentition   Pulmonary neg pulmonary ROS,    Pulmonary exam normal        Cardiovascular negative cardio ROS Normal cardiovascular exam Rhythm:regular Rate:Normal     Neuro/Psych negative neurological ROS  negative psych ROS   GI/Hepatic negative GI ROS, Neg liver ROS,   Endo/Other  negative endocrine ROS  Renal/GU negative Renal ROS  negative genitourinary   Musculoskeletal   Abdominal   Peds  Hematology negative hematology ROS (+)   Anesthesia Other Findings Past Medical History: 08/14/2006: Dysplastic nevus     Comment:  R mid back - mild  08/14/2007: Dysplastic nevus     Comment:  L lower med leg - mild  11/24/2008: Dysplastic nevus     Comment:  L mid back - mild  Past Surgical History: No date: CESAREAN SECTION No date: TONSILLECTOMY BMI    Body Mass Index: 31.77 kg/m     Reproductive/Obstetrics negative OB ROS                             Anesthesia Physical Anesthesia Plan  ASA: 2  Anesthesia Plan: General   Post-op Pain Management:    Induction:   PONV Risk Score and Plan:   Airway Management Planned:   Additional Equipment:   Intra-op Plan:   Post-operative Plan:   Informed Consent: I have reviewed the patients History and Physical, chart, labs and discussed the procedure including the risks, benefits and alternatives for the proposed anesthesia with the patient or authorized representative who has indicated his/her understanding and acceptance.     Dental Advisory Given  Plan Discussed with: CRNA  Anesthesia Plan Comments:         Anesthesia  Quick Evaluation

## 2021-11-08 ENCOUNTER — Encounter: Payer: Self-pay | Admitting: Gastroenterology

## 2021-11-08 NOTE — Anesthesia Postprocedure Evaluation (Signed)
Anesthesia Post Note  Patient: Mary Bennett  Procedure(s) Performed: COLONOSCOPY WITH PROPOFOL  Patient location during evaluation: PACU Anesthesia Type: General Level of consciousness: awake and alert Pain management: pain level controlled Vital Signs Assessment: post-procedure vital signs reviewed and stable Respiratory status: spontaneous breathing, nonlabored ventilation and respiratory function stable Cardiovascular status: blood pressure returned to baseline and stable Postop Assessment: no apparent nausea or vomiting Anesthetic complications: no   No notable events documented.   Last Vitals:  Vitals:   11/07/21 1605 11/07/21 1615  BP: 120/78 114/82  Pulse: 68 83  Resp: 14 13  Temp:    SpO2: 100% 100%    Last Pain:  Vitals:   11/08/21 0835  TempSrc:   PainSc: 0-No pain                 [place] Ouch

## 2022-05-11 ENCOUNTER — Other Ambulatory Visit: Payer: Self-pay

## 2022-05-11 ENCOUNTER — Inpatient Hospital Stay
Admission: EM | Admit: 2022-05-11 | Discharge: 2022-05-14 | DRG: 378 | Disposition: A | Discharge status: Home / Self Care | Payer: Medicare Other | Attending: Family Medicine | Admitting: Family Medicine

## 2022-05-11 ENCOUNTER — Encounter: Payer: Self-pay | Admitting: Internal Medicine

## 2022-05-11 ENCOUNTER — Emergency Department: Payer: Medicare Other

## 2022-05-11 DIAGNOSIS — I9589 Other hypotension: Secondary | ICD-10-CM | POA: Diagnosis present

## 2022-05-11 DIAGNOSIS — K625 Hemorrhage of anus and rectum: Principal | ICD-10-CM

## 2022-05-11 DIAGNOSIS — K2901 Acute gastritis with bleeding: Secondary | ICD-10-CM | POA: Diagnosis present

## 2022-05-11 DIAGNOSIS — K921 Melena: Secondary | ICD-10-CM | POA: Diagnosis not present

## 2022-05-11 DIAGNOSIS — Z6831 Body mass index (BMI) 31.0-31.9, adult: Secondary | ICD-10-CM

## 2022-05-11 DIAGNOSIS — E669 Obesity, unspecified: Secondary | ICD-10-CM | POA: Diagnosis present

## 2022-05-11 DIAGNOSIS — K922 Gastrointestinal hemorrhage, unspecified: Secondary | ICD-10-CM | POA: Diagnosis not present

## 2022-05-11 DIAGNOSIS — K3189 Other diseases of stomach and duodenum: Secondary | ICD-10-CM | POA: Diagnosis present

## 2022-05-11 DIAGNOSIS — D62 Acute posthemorrhagic anemia: Secondary | ICD-10-CM | POA: Diagnosis present

## 2022-05-11 DIAGNOSIS — E860 Dehydration: Secondary | ICD-10-CM | POA: Diagnosis present

## 2022-05-11 DIAGNOSIS — K5730 Diverticulosis of large intestine without perforation or abscess without bleeding: Secondary | ICD-10-CM | POA: Diagnosis present

## 2022-05-11 DIAGNOSIS — A044 Other intestinal Escherichia coli infections: Secondary | ICD-10-CM | POA: Diagnosis present

## 2022-05-11 DIAGNOSIS — D72829 Elevated white blood cell count, unspecified: Secondary | ICD-10-CM | POA: Insufficient documentation

## 2022-05-11 DIAGNOSIS — D649 Anemia, unspecified: Secondary | ICD-10-CM | POA: Insufficient documentation

## 2022-05-11 DIAGNOSIS — K648 Other hemorrhoids: Secondary | ICD-10-CM | POA: Diagnosis present

## 2022-05-11 DIAGNOSIS — I959 Hypotension, unspecified: Secondary | ICD-10-CM | POA: Insufficient documentation

## 2022-05-11 DIAGNOSIS — K449 Diaphragmatic hernia without obstruction or gangrene: Secondary | ICD-10-CM | POA: Diagnosis present

## 2022-05-11 DIAGNOSIS — I700 Atherosclerosis of aorta: Secondary | ICD-10-CM | POA: Diagnosis present

## 2022-05-11 LAB — COMPREHENSIVE METABOLIC PANEL WITH GFR
ALT: 14 U/L (ref 0–44)
AST: 21 U/L (ref 15–41)
Albumin: 3.6 g/dL (ref 3.5–5.0)
Alkaline Phosphatase: 62 U/L (ref 38–126)
Anion gap: 8 (ref 5–15)
BUN: 23 mg/dL (ref 8–23)
CO2: 24 mmol/L (ref 22–32)
Calcium: 9.4 mg/dL (ref 8.9–10.3)
Chloride: 106 mmol/L (ref 98–111)
Creatinine, Ser: 0.78 mg/dL (ref 0.44–1.00)
GFR, Estimated: >60 mL/min
Glucose, Bld: 124 mg/dL — ABNORMAL HIGH (ref 70–99)
Potassium: 3.5 mmol/L (ref 3.5–5.1)
Sodium: 138 mmol/L (ref 135–145)
Total Bilirubin: 0.4 mg/dL (ref 0.3–1.2)
Total Protein: 6.5 g/dL (ref 6.5–8.1)

## 2022-05-11 LAB — TYPE AND SCREEN
ABO/RH(D): O POS
Antibody Screen: NEGATIVE

## 2022-05-11 LAB — CBC
HCT: 30 % — ABNORMAL LOW (ref 36.0–46.0)
Hemoglobin: 10 g/dL — ABNORMAL LOW (ref 12.0–15.0)
MCH: 30.1 pg (ref 26.0–34.0)
MCHC: 33.3 g/dL (ref 30.0–36.0)
MCV: 90.4 fL (ref 80.0–100.0)
Platelets: 341 10*3/uL (ref 150–400)
RBC: 3.32 MIL/uL — ABNORMAL LOW (ref 3.87–5.11)
RDW: 12.6 % (ref 11.5–15.5)
WBC: 12 10*3/uL — ABNORMAL HIGH (ref 4.0–10.5)
nRBC: 0 % (ref 0.0–0.2)

## 2022-05-11 LAB — FERRITIN: Ferritin: 28 ng/mL (ref 11–307)

## 2022-05-11 LAB — C DIFFICILE QUICK SCREEN W PCR REFLEX
C Diff antigen: NEGATIVE
C Diff interpretation: NOT DETECTED
C Diff toxin: NEGATIVE

## 2022-05-11 LAB — IRON AND TIBC
Iron: 80 ug/dL (ref 28–170)
Saturation Ratios: 24 % (ref 10.4–31.8)
TIBC: 329 ug/dL (ref 250–450)
UIBC: 249 ug/dL

## 2022-05-11 LAB — PROTIME-INR
INR: 1 (ref 0.8–1.2)
Prothrombin Time: 13.4 seconds (ref 11.4–15.2)

## 2022-05-11 LAB — RETICULOCYTES
Immature Retic Fract: 10.8 % (ref 2.3–15.9)
RBC.: 3.34 MIL/uL — ABNORMAL LOW (ref 3.87–5.11)
Retic Count, Absolute: 82.2 10*3/uL (ref 19.0–186.0)
Retic Ct Pct: 2.5 % (ref 0.4–3.1)

## 2022-05-11 LAB — FOLATE: Folate: 33 ng/mL (ref >5.9)

## 2022-05-11 LAB — LIPASE, BLOOD: Lipase: 35 U/L (ref 11–51)

## 2022-05-11 MED ORDER — PANTOPRAZOLE INFUSION (NEW) - SIMPLE MED
8.0000 mg/h | INTRAVENOUS | Status: DC
  Administered 2022-05-11 – 2022-05-13 (×4): 8 mg/h via INTRAVENOUS
  Filled 2022-05-11 (×4): qty 100

## 2022-05-11 MED ORDER — PANTOPRAZOLE SODIUM 40 MG IV SOLR: 40.0000 mg | Freq: Two times a day (BID) | INTRAVENOUS | Status: DC

## 2022-05-11 MED ORDER — IOHEXOL 300 MG/ML  SOLN
100.0000 mL | Freq: Once | INTRAMUSCULAR | Status: AC | PRN
  Administered 2022-05-11: 100 mL via INTRAVENOUS

## 2022-05-11 MED ORDER — ACETAMINOPHEN 650 MG RE SUPP: 650.0000 mg | Freq: Four times a day (QID) | RECTAL | Status: DC | PRN

## 2022-05-11 MED ORDER — PANTOPRAZOLE SODIUM 40 MG IV SOLR
40.0000 mg | Freq: Once | INTRAVENOUS | Status: AC
  Administered 2022-05-11: 40 mg via INTRAVENOUS
  Filled 2022-05-11: qty 10

## 2022-05-11 MED ORDER — ONDANSETRON HCL 4 MG PO TABS: 4.0000 mg | ORAL_TABLET | Freq: Four times a day (QID) | ORAL | Status: DC | PRN

## 2022-05-11 MED ORDER — SENNOSIDES-DOCUSATE SODIUM 8.6-50 MG PO TABS: 1.0000 | ORAL_TABLET | Freq: Every evening | ORAL | Status: DC | PRN

## 2022-05-11 MED ORDER — LORAZEPAM 0.5 MG PO TABS: 0.5000 mg | ORAL_TABLET | Freq: Four times a day (QID) | ORAL | Status: DC | PRN

## 2022-05-11 MED ORDER — SODIUM CHLORIDE 0.9 % IV BOLUS
1000.0000 mL | Freq: Once | INTRAVENOUS | Status: AC
  Administered 2022-05-11: 1000 mL via INTRAVENOUS

## 2022-05-11 MED ORDER — ACETAMINOPHEN 325 MG PO TABS: 650.0000 mg | ORAL_TABLET | Freq: Four times a day (QID) | ORAL | Status: DC | PRN

## 2022-05-11 MED ORDER — LACTATED RINGERS IV BOLUS
500.0000 mL | Freq: Once | INTRAVENOUS | Status: AC
  Administered 2022-05-11: 500 mL via INTRAVENOUS

## 2022-05-11 MED ORDER — MELATONIN 5 MG PO TABS: 5.0000 mg | ORAL_TABLET | Freq: Every evening | ORAL | Status: DC | PRN

## 2022-05-11 MED ORDER — ONDANSETRON HCL 4 MG/2ML IJ SOLN: 4.0000 mg | Freq: Four times a day (QID) | INTRAMUSCULAR | Status: DC | PRN

## 2022-05-11 NOTE — Assessment & Plan Note (Addendum)
No bowel changes with descriptions consistent with melena stool Concerns for upper GI bleed Nursing order to ensure and maintain 2 peripheral IV (preferred large-bore), Protonix bolus and GGT initiated We will keep her clear liquid with n.p.o. after midnight Gastroenterology has been consulted via secure chat and epic order to Dr. Marius Ditch Admit to telemetry medical, inpatient

## 2022-05-11 NOTE — Hospital Course (Signed)
Ms. Mary Bennett is a 72 year old female with dysplastic nevus who presents emergency department for chief concerns of diarrhea that is black in color. Patient ate grilled chicken prior to admission. Patient was placed on PPI, consult from GI is obtained.  Hemoglobin dropped down to 7.8 on 3/9, patient was giving IV iron.  B12 level borderline at 208, received B12 injection while pending homocystine level. Stool study came back with enteropathogenic E. 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 EGD was performed on 3/10, showed erosive gastritis.  Capsule endoscopy will be scheduled as outpatient.

## 2022-05-11 NOTE — Assessment & Plan Note (Signed)
I suspect this is reactive as patient does not endorse symptoms concerning for infectious etiology No indications for antibiotic at this time CBC in a.m.

## 2022-05-11 NOTE — ED Triage Notes (Signed)
Pt comes with c/o blood in stool. Pt states this all started on Wednesday. Pt denies any thinners. Pt states some dizziness.

## 2022-05-11 NOTE — Assessment & Plan Note (Addendum)
Mild, symptomatic anemia Anemia panel ordered No prior CBC for compare

## 2022-05-11 NOTE — ED Provider Notes (Signed)
[facility] Provider Note    Event Date/Time   First MD Initiated Contact with Patient 05/11/22 1342     (approximate)   History   Rectal Bleeding   HPI  Mary Bennett is a 72 y.o. female with no significant past medical history presents to the emergency department with rectal bleeding.  Endorses 2 days of rectal bleeding with dark black stool.  Multiple episodes of black stool over the past 2 days.  Denies any abdominal pain nausea vomiting or fever.  Not on anticoagulation.  No daily NSAID use.  Denies any alcohol use.  Prior colonoscopy 1 year ago that was normal.  States that she was driving to the emergency department given the fact that she was having a GI bleed and she called her primary care physician and was told to come to the ER, when she got to the ER states that she started to feel very poorly and like she might pass out.  Her blood pressure was checked in triage and was 60 systolic.  States that since laying back down in the emergency department she is feeling much better.     Physical Exam   Triage Vital Signs: ED Triage Vitals  Enc Vitals Group     BP 05/11/22 1341 (!) 67/23     Pulse Rate 05/11/22 1341 77     Resp 05/11/22 1341 19     Temp 05/11/22 1349 98.1 F (36.7 C)     Temp Source 05/11/22 1349 Oral     SpO2 05/11/22 1341 100 %     Weight --      Height --      Head Circumference --      Peak Flow --      Pain Score 05/11/22 1340 0     Pain Loc --      Pain Edu? --      Excl. in [place]? --     Most recent vital signs: Vitals:   05/11/22 1510 05/11/22 1530  BP: 121/65 116/74  Pulse:  79  Resp:    Temp:    SpO2:  100%    Physical Exam Constitutional:      Appearance: She is well-developed.  HENT:     Head: Atraumatic.  Eyes:     Conjunctiva/sclera: Conjunctivae normal.  Cardiovascular:     Rate and Rhythm: Regular rhythm.  Pulmonary:     Effort: No respiratory distress.  Abdominal:     General: There is no  distension.  Genitourinary:    Comments: DRE with melena Musculoskeletal:        General: Normal range of motion.     Cervical back: Normal range of motion.  Skin:    General: Skin is warm.     Capillary Refill: Capillary refill takes less than 2 seconds.  Neurological:     Mental Status: She is alert. Mental status is at baseline.  Psychiatric:        Mood and Affect: Mood normal.     IMPRESSION / MDM / [place] / ED COURSE  I reviewed the triage vital signs and the nursing notes.  On arrival to the emergency department patient's blood pressure was 67/23, repeat blood pressure while lying down 98/67 and since laying in the emergency department blood pressure is now 121/65.  Differential diagnosis including upper GI bleed, lower GI bleed, infectious diarrheal illness, C. Difficile  EKG  I, Nathaniel Man, the attending physician, personally viewed and  interpreted this ECG.   Rate: Normal  Rhythm: Normal sinus  Axis: Normal  Intervals: Normal  ST&T Change: None  No tachycardic or bradycardic dysrhythmias while on cardiac telemetry.  RADIOLOGY I independently reviewed imaging, my interpretation of imaging: CT scan with no acute intra-abdominal pathology.  Discussed incidental findings with the patient.  LABS (all labs ordered are listed, but only abnormal results are displayed) Labs interpreted as -    Labs Reviewed  COMPREHENSIVE METABOLIC PANEL - Abnormal; Notable for the following components:      Result Value   Glucose, Bld 124 (*)    All other components within normal limits  CBC - Abnormal; Notable for the following components:   WBC 12.0 (*)    RBC 3.32 (*)    Hemoglobin 10.0 (*)    HCT 30.0 (*)    All other components within normal limits  GASTROINTESTINAL PANEL BY PCR, STOOL (REPLACES STOOL CULTURE)  C DIFFICILE QUICK SCREEN W PCR REFLEX    PROTIME-INR  LIPASE, BLOOD  POC OCCULT BLOOD, ED  TYPE AND SCREEN    TREATMENT  1 L of IV fluids,  IV Protonix  MDM  Clinical picture concerning for GI bleed.  Initially had an episode of hypotension that has resolved.  No signs or symptoms that are concerning for sepsis.  Hemoglobin with 10.0, on chart review unable to find a prior hemoglobin.  Consulted hospitalist for admission for GI bleed and melena     PROCEDURES:  Critical Care performed: No  Procedures  Patient's presentation is most consistent with acute presentation with potential threat to life or bodily function.   MEDICATIONS ORDERED IN ED: Medications  acetaminophen (TYLENOL) tablet 650 mg (has no administration in time range)    Or  acetaminophen (TYLENOL) suppository 650 mg (has no administration in time range)  ondansetron (ZOFRAN) tablet 4 mg (has no administration in time range)    Or  ondansetron (ZOFRAN) injection 4 mg (has no administration in time range)  senna-docusate (Senokot-S) tablet 1 tablet (has no administration in time range)  pantoprozole (PROTONIX) 80 mg /NS 100 mL infusion (has no administration in time range)  pantoprazole (PROTONIX) injection 40 mg (has no administration in time range)  pantoprazole (PROTONIX) injection 40 mg (has no administration in time range)  sodium chloride 0.9 % bolus 1,000 mL (1,000 mLs Intravenous New Bag/Given 05/11/22 1440)  iohexol (OMNIPAQUE) 300 MG/ML solution 100 mL (100 mLs Intravenous Contrast Given 05/11/22 1457)  pantoprazole (PROTONIX) injection 40 mg (40 mg Intravenous Given 05/11/22 1535)    FINAL CLINICAL IMPRESSION(S) / ED DIAGNOSES   Final diagnoses:  Rectal bleeding     Rx / DC Orders   ED Discharge Orders     None        Note:  This document was prepared using Dragon voice recognition software and may include unintentional dictation errors.   Nathaniel Man, MD 05/11/22 [phone]

## 2022-05-11 NOTE — Assessment & Plan Note (Addendum)
Possibly upper GI bleed as patient describes the stool as black melena Patient last had a colonoscopy in 2023 and she reports it was normal

## 2022-05-11 NOTE — Assessment & Plan Note (Addendum)
Secondary to blood loss Patient is status post sodium chloride 1 L bolus per EDP Ordered LR 500 mL bolus one-time dose

## 2022-05-11 NOTE — ED Notes (Signed)
Pt states she is now dizzy. Pt stats she didn't feel this way until she just stood up to come back to triage. Pt pale and diaphoretic. Bp reading low. Pt eyes twitching and pt appears zoned out. Pt taken straight to ED 8 with RN at bedside. Pt placed on pads for precautions. Pt placed on monitor and staff at bedside to assist with IV and blood.

## 2022-05-11 NOTE — ED Notes (Addendum)
PT able to speak in full sentences, a&ox4 on RA, lying flat on stretcher. She does appear pale and diaphoretic. Placed on pads, zoll at bedside.

## 2022-05-11 NOTE — H&P (Signed)
History and Physical   ASHELY Bennett [id number] DOB: 06-26-1950 DOA: 05/11/2022  PCP: Derinda Late, MD  Patient coming from: Home  I have personally briefly reviewed patient's old medical records in [place].  Chief Concern: diarrhea and black stool, symptomatic anemia  HPI: Ms. Mary Bennett is a 72 year old female with dysplastic nevus who presents emergency department for chief concerns of diarrhea that is black in color.  Vitals in the ED showed temperature of 98.1, respiration rate of 18, heart rate 74, blood pressure 104/64, SpO2 of 96% on room air.  Serum sodium is 138, potassium 2.5, chloride 106, bicarb 24, BUN of 23, serum creatinine of 0.78, nonfasting blood glucose 124, EGFR greater than 60, WBC 12, hemoglobin 10.0, platelets of 341.  CT abdomen pelvis with contrast: Was read as air-fluid levels in descending colon and proximal sigmoid colon raising the possibility of diarrheal process.  Redundant sigmoid colon extends up into the left upper quadrant without dilated bowel or indicators of sigmoid volvulus.  Cholelithiasis.  Type I hiatal hernia.  Aortic atherosclerosis.  ED treatment: Sodium chloride 1 L bolus.  Protonix 40 mg IV one-time dose. --------------------------- At bedside patient was able to tell me her name, her age, the current calendar year and her current location.  She reports that over the last 3 to 4 days, she has had increased black diarrhea.  She experiences 3-4 episodes per day.  She states she is never had this happen before.  She endorses that she was treated dizziness upon standing, prompting her to come to the emergency department for further evaluation.    She denies over-the-counter NSAID use including ibuprofen, Aleve, Motrin, BC powder, Goody powder, excessive EtOH use.  She reports she has never had a colonoscopy. She endorses infrequent etoh, one - two drinks per quarter, usually on vacation.  She does not take iron tablets.  She  reports she has never been diagnosed with high or low blood pressure.    She denies chest pain, shortness of breath, abdominal pain, dysuria, hematuria, syncope.  Social history: She lives with her husband. She denies tobacco, recreational drug use.  She is tired and formerly was Print production planner at [employer]: Constitutional: no weight change, no fever ENT/Mouth: no sore throat, no rhinorrhea Eyes: no eye pain, no vision changes Cardiovascular: no chest pain, no dyspnea,  no edema, no palpitations Respiratory: no cough, no sputum, no wheezing Gastrointestinal: no nausea, no vomiting, + dark/melena diarrhea, no constipation Genitourinary: no urinary incontinence, no dysuria, no hematuria Musculoskeletal: no arthralgias, no myalgias Skin: no skin lesions, no pruritus, Neuro: no weakness, no loss of consciousness, no syncope Psych: no anxiety, no depression, no decrease appetite Heme/Lymph: no bruising, no bleeding  ED Course: discussed with EDP, patient requiring hospitalization for concerns of upper gi bleed  Assessment/Plan  Principal Problem:   Upper GI bleed Active Problems:   Rectal bleed   Normocytic anemia   Leukocytosis   Hypotension   Aortic atherosclerosis (HCC)   Assessment and Plan:  * Upper GI bleed No bowel changes with descriptions consistent with melena stool Concerns for upper GI bleed Nursing order to ensure and maintain 2 peripheral IV (preferred large-bore), Protonix bolus and GGT initiated We will keep her clear liquid with n.p.o. after midnight Gastroenterology has been consulted via secure chat and epic order to Dr. Marius Ditch Admit to telemetry medical, inpatient  Aortic atherosclerosis [facility]) Extensive discussion counseling regarding healthy diet and exercise.  Counseled patient on eating  the rainbow of fresh vegetables and fruits, kale, chart, pears, baked salmon and chicken. Avoid processed foods and embracing slow whole some  natural ingredients  Hypotension Secondary to blood loss Patient is status post sodium chloride 1 L bolus per EDP Ordered LR 500 mL bolus one-time dose   Leukocytosis I suspect this is reactive as patient does not endorse symptoms concerning for infectious etiology No indications for antibiotic at this time CBC in a.m.  Normocytic anemia Mild, symptomatic anemia Anemia panel ordered No prior CBC for compare  Rectal bleed Possibly upper GI bleed as patient describes the stool as black melena Patient last had a colonoscopy in 2023 and she reports it was normal  Chart reviewed.   DVT prophylaxis: TED hose; AM team to initiate pharmacologic DVT prophylaxis when the benefits outweigh the risk Code Status: Full code Diet: Clear liquids; n.p.o. after midnight Family Communication: No Disposition Plan: Pending clinical course Consults called: Gastroenterology Admission status: Telemetry medical, inpatient  Past Medical History:  Diagnosis Date   Dysplastic nevus 08/14/2006   R mid back - mild    Dysplastic nevus 08/14/2007   L lower med leg - mild    Dysplastic nevus 11/24/2008   L mid back - mild    Past Surgical History:  Procedure Laterality Date   CESAREAN SECTION     COLONOSCOPY WITH PROPOFOL N/A 11/07/2021   Procedure: COLONOSCOPY WITH PROPOFOL;  Surgeon: Lesly Rubenstein, MD;  Location: [facility] ENDOSCOPY;  Service: Endoscopy;  Laterality: N/A;   TONSILLECTOMY     Social History:  reports that she has never smoked. She has never used smokeless tobacco. She reports that she does not currently use alcohol. She reports that she does not use drugs.  No Known Allergies History reviewed. No pertinent family history. Family history: Family history reviewed and not pertinent  Prior to Admission medications   Not on File   Physical Exam: Vitals:   05/11/22 1530 05/11/22 1600 05/11/22 1639 05/11/22 1657  BP: 116/74 111/65  123/63  Pulse: 79 77  71  Resp:    18  Temp:    (!) 97.5 F (36.4 C) (!) 97 F (36.1 C)  TempSrc:   Oral   SpO2: 100% 98%  100%   Constitutional: appears younger than chronological age, NAD, calm, comfortable Eyes: PERRL, lids and conjunctivae normal ENMT: Mucous membranes are moist. Posterior pharynx clear of any exudate or lesions. Age-appropriate dentition. Hearing appropriate Neck: normal, supple, no masses, no thyromegaly Respiratory: clear to auscultation bilaterally, no wheezing, no crackles. Normal respiratory effort. No accessory muscle use.  Cardiovascular: Regular rate and rhythm, no murmurs / rubs / gallops. No extremity edema. 2+ pedal pulses. No carotid bruits.  Abdomen: no tenderness, no masses palpated, no hepatosplenomegaly. Bowel sounds positive.  Musculoskeletal: no clubbing / cyanosis. No joint deformity upper and lower extremities. Good ROM, no contractures, no atrophy. Normal muscle tone.  Skin: no rashes, lesions, ulcers. No induration Neurologic: Sensation intact. Strength 5/5 in all 4.  Psychiatric: Normal judgment and insight. Alert and oriented x 3. Normal mood.   EKG: independently reviewed, showing sinus rhythm with rate of 71, qtc 433  Chest x-ray on Admission: I personally reviewed and I agree with radiologist reading as below.  CT ABDOMEN PELVIS W CONTRAST  Result Date: 05/11/2022 CLINICAL DATA:  Left lower quadrant abdominal pain and rectal bleeding EXAM: CT ABDOMEN AND PELVIS WITH CONTRAST TECHNIQUE: Multidetector CT imaging of the abdomen and pelvis was performed using the standard protocol following  bolus administration of intravenous contrast. RADIATION DOSE REDUCTION: This exam was performed according to the departmental dose-optimization program which includes automated exposure control, adjustment of the mA and/or kV according to patient size and/or use of iterative reconstruction technique. CONTRAST:  160m OMNIPAQUE IOHEXOL 300 MG/ML  SOLN COMPARISON:  None Available. FINDINGS: Lower chest:  Subsegmental atelectasis in both lower lobes. Small type 1 hiatal hernia. Mild descending thoracic aortic atherosclerotic vascular calcification. Hepatobiliary: Multiple gallstones in the gallbladder containing nitrogen gas. No significant hepatic parenchymal lesion is identified. No biliary dilatation. Pancreas: Unremarkable Spleen: Unremarkable Adrenals/Urinary Tract: Unremarkable Stomach/Bowel: Redundant sigmoid colon. Air fluid levels in the descending colon and proximal sigmoid colon raising the possibility of diarrheal process. Vascular/Lymphatic: Atherosclerosis is present, including aortoiliac atherosclerotic disease. Reproductive: Unremarkable Other: No supplemental non-categorized findings. Musculoskeletal: Levoconvex lumbar scoliosis. Lower lumbar spondylosis and degenerative disc disease with mild left foraminal impingement at L4-5 and suspected mild right foraminal impingement at L1-2 and L3-4. IMPRESSION: 1. Air-fluid levels in the descending colon and proximal sigmoid colon raising the possibility of diarrheal process. 2. Redundant sigmoid colon extends up into the left upper quadrant, without dilated bowel or indicators of sigmoid volvulus. 3. Cholelithiasis. 4. Small type 1 hiatal hernia. 5. Levoconvex lumbar scoliosis and spondylosis with mild impingement at several levels. 6. Aortic atherosclerosis. 7. Subsegmental atelectasis in both lower lobes. Aortic Atherosclerosis (ICD10-I70.0). Electronically Signed   By: WVan ClinesM.D.   On: 05/11/2022 15:17    Labs on Admission: I have personally reviewed following labs  CBC: Recent Labs  Lab 05/11/22 1348  WBC 12.0*  HGB 10.0*  HCT 30.0*  MCV 90.4  PLT [id number]  Basic Metabolic Panel: Recent Labs  Lab 05/11/22 1348  NA 138  K 3.5  CL 106  CO2 24  GLUCOSE 124*  BUN 23  CREATININE 0.78  CALCIUM 9.4   GFR: CrCl cannot be calculated (Unknown ideal weight.).  Liver Function Tests: Recent Labs  Lab 05/11/22 1348  AST 21   ALT 14  ALKPHOS 62  BILITOT 0.4  PROT 6.5  ALBUMIN 3.6   Recent Labs  Lab 05/11/22 1348  LIPASE 35   Coagulation Profile: Recent Labs  Lab 05/11/22 1348  INR 1.0   This document was prepared using DWheelerand may include unintentional dictation errors.  Dr. CTobie PoetTriad Hospitalists  If 7PM-7AM, please contact overnight-coverage provider If 7AM-7PM, please contact day coverage provider [email or web address]  05/11/2022, 4:59 PM

## 2022-05-11 NOTE — Assessment & Plan Note (Signed)
Extensive discussion counseling regarding healthy diet and exercise.  Counseled patient on eating the rainbow of fresh vegetables and fruits, kale, chart, pears, baked salmon and chicken. Avoid processed foods and embracing slow whole some natural ingredients

## 2022-05-12 DIAGNOSIS — D62 Acute posthemorrhagic anemia: Secondary | ICD-10-CM | POA: Insufficient documentation

## 2022-05-12 DIAGNOSIS — E669 Obesity, unspecified: Secondary | ICD-10-CM | POA: Insufficient documentation

## 2022-05-12 DIAGNOSIS — A044 Other intestinal Escherichia coli infections: Secondary | ICD-10-CM | POA: Insufficient documentation

## 2022-05-12 DIAGNOSIS — K921 Melena: Secondary | ICD-10-CM

## 2022-05-12 LAB — GASTROINTESTINAL PANEL BY PCR, STOOL (REPLACES STOOL CULTURE)

## 2022-05-12 LAB — HEMOGLOBIN
Hemoglobin: 8.7 g/dL — ABNORMAL LOW (ref 12.0–15.0)
Hemoglobin: 8.8 g/dL — ABNORMAL LOW (ref 12.0–15.0)

## 2022-05-12 LAB — CBC
HCT: 23.1 % — ABNORMAL LOW (ref 36.0–46.0)
Hemoglobin: 7.8 g/dL — ABNORMAL LOW (ref 12.0–15.0)
MCH: 30 pg (ref 26.0–34.0)
MCHC: 33.8 g/dL (ref 30.0–36.0)
MCV: 88.8 fL (ref 80.0–100.0)
Platelets: 240 10*3/uL (ref 150–400)
RBC: 2.6 MIL/uL — ABNORMAL LOW (ref 3.87–5.11)
RDW: 12.8 % (ref 11.5–15.5)
WBC: 7.6 10*3/uL (ref 4.0–10.5)
nRBC: 0 % (ref 0.0–0.2)

## 2022-05-12 LAB — BASIC METABOLIC PANEL
Anion gap: 7 (ref 5–15)
BUN: 21 mg/dL (ref 8–23)
CO2: 25 mmol/L (ref 22–32)
Calcium: 8.4 mg/dL — ABNORMAL LOW (ref 8.9–10.3)
Chloride: 108 mmol/L (ref 98–111)
Creatinine, Ser: 0.66 mg/dL (ref 0.44–1.00)
GFR, Estimated: >60 mL/min (ref >60)
Glucose, Bld: 100 mg/dL — ABNORMAL HIGH (ref 70–99)
Potassium: 3.6 mmol/L (ref 3.5–5.1)
Sodium: 140 mmol/L (ref 135–145)

## 2022-05-12 LAB — VITAMIN B12: Vitamin B-12: 208 pg/mL (ref 180–914)

## 2022-05-12 MED ORDER — SODIUM CHLORIDE 0.9 % IV SOLN
300.0000 mg | Freq: Once | INTRAVENOUS | Status: AC
  Administered 2022-05-12: 300 mg via INTRAVENOUS
  Filled 2022-05-12: qty 300

## 2022-05-12 MED ORDER — CIPROFLOXACIN IN D5W 400 MG/200ML IV SOLN
400.0000 mg | Freq: Two times a day (BID) | INTRAVENOUS | Status: DC
  Administered 2022-05-12 (×2): 400 mg via INTRAVENOUS
  Filled 2022-05-12 (×3): qty 200

## 2022-05-12 MED ORDER — CYANOCOBALAMIN 1000 MCG/ML IJ SOLN
1000.0000 ug | Freq: Once | INTRAMUSCULAR | Status: AC
  Administered 2022-05-12: 1000 ug via INTRAMUSCULAR
  Filled 2022-05-12: qty 1

## 2022-05-12 NOTE — Progress Notes (Signed)
Stool sample results received from lab. Sample positive for Enteropathogenic E-coli. Anne Ng notified.

## 2022-05-12 NOTE — Progress Notes (Signed)
  Progress Note   Patient: Mary Bennett [id number] DOB: 31-Mar-1950 DOA: 05/11/2022     1 DOS: the patient was seen and examined on 05/12/2022   Brief hospital course: Ms. Itzamar Traynor is a 72 year old female with dysplastic nevus who presents emergency department for chief concerns of diarrhea that is black in color. Patient ate grilled chicken prior to admission. Patient was placed on PPI, consult from GI is obtained.  Hemoglobin dropped down to 7.8 on 3/9, patient was giving IV iron.  B12 level borderline at 208, received B12 injection while pending homocystine level. Stool study came back with enteropathogenic E. 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   Principal Problem:   Upper GI bleed Active Problems:   Rectal bleed   Normocytic anemia   Leukocytosis   Hypotension   Aortic atherosclerosis (HCC)   Acute blood loss anemia   E coli enteritis   Obesity (BMI 30-39.9)   Assessment and Plan:  GI bleed Acute blood loss anemia. E. coli enteritis. Transient hypotension secondary to dehydration. Patient still has some black loose stool today, but is slowing down.  This is difficult to tell if patient has upper GI bleeding versus bleeding from the colon.  PPI will be continued, pending GI consult. I will start the 3 days of Cipro. Patient also receiving IV iron today, B12 level borderline, give B12 injection.  Ordered homocystine level.  Continue monitor hemoglobin every 8 hours, transfuse with hemoglobin less than 7. Had transient hypotension at time admission, blood pressure stabilized after giving fluids.  Aortic atherosclerosis ([place]) Follow-up with PCP as outpatient.       Subjective:  Patient still had a small amount of loose stool today, still looks black.  No abdominal pain or nausea vomiting.  No dizziness.  Physical Exam: Vitals:   05/11/22 2341 05/12/22 0436 05/12/22 0838 05/12/22 1140  BP: 118/67 119/71 120/73 118/69   Pulse: 77 73 70 78  Resp: 18 16 16 17   Temp: 97.9 F (36.6 C) 98.3 F (36.8 C) 97.9 F (36.6 C) 98.2 F (36.8 C)  TempSrc: Oral     SpO2: 100% 99% 98% 100%  Weight:      Height:       General exam: Appears calm and comfortable, obese Respiratory system: Clear to auscultation. Respiratory effort normal. Cardiovascular system: S1 & S2 heard, RRR. No JVD, murmurs, rubs, gallops or clicks. No pedal edema. Gastrointestinal system: Abdomen is nondistended, soft and nontender. No organomegaly or masses felt. Normal bowel sounds heard. Central nervous system: Alert and oriented. No focal neurological deficits. Extremities: Symmetric 5 x 5 power. Skin: No rashes, lesions or ulcers Psychiatry: Judgement and insight appear normal. Mood & affect appropriate.    Data Reviewed:  Lab results reviewed.  Family Communication: None  Disposition: Status is: Inpatient Remains inpatient appropriate because: Severity of disease      Time spent: 35 minutes  Author: Sharen Hones, MD 05/12/2022 12:06 PM  For on call review [email or web address].

## 2022-05-12 NOTE — Consult Note (Signed)
Cephas Darby, MD [address]  [place]  [place], [place] [address]  Main: [phone]  Fax: [phone] Pager: [phone]   Consultation  Referring Provider:     No ref. provider found Primary Care Physician:  Derinda Late, MD Primary Gastroenterologist:  Dr. Haig Prophet        Reason for Consultation:     Melena  Date of Admission:  05/11/2022 Date of Consultation:  05/12/2022         HPI:   Mary Bennett is a 72 y.o. female no significant past medical history presented with 2 days history of several episodes of black stools with scant amount of red blood.  She felt lightheaded, and week, therefore presented to ER.  She was hemodynamically stable, labs revealed hemoglobin 10, BUN/creatinine 23/0.78.  We do not have baseline hemoglobin.  She underwent CT abdomen pelvis with contrast which revealed air-fluid levels in the left colon, GI profile PCR came back positive for E. coli, C. difficile was negative.  There was no evidence of diverticulitis.  Patient received IV fluids and started on pantoprazole drip, kept NPO.  When I saw patient this morning, she is sitting in the chair, comfortable, denies any symptoms, denies any epigastric pain.  She is receiving IV iron.  Her hemoglobin was 8.7 today  Patient does not smoke or drink alcohol  NSAIDs: None  Antiplts/Anticoagulants/Anti thrombotics: None  GI Procedures: Colonoscopy for screening 11/07/2021 Diverticulosis in the sigmoid and distal transverse colon, internal hemorrhoids  Past Medical History:  Diagnosis Date   Dysplastic nevus 08/14/2006   R mid back - mild    Dysplastic nevus 08/14/2007   L lower med leg - mild    Dysplastic nevus 11/24/2008   L mid back - mild     Past Surgical History:  Procedure Laterality Date   CESAREAN SECTION     COLONOSCOPY WITH PROPOFOL N/A 11/07/2021   Procedure: COLONOSCOPY WITH PROPOFOL;  Surgeon: Lesly Rubenstein, MD;  Location: [facility] ENDOSCOPY;  Service: Endoscopy;   Laterality: N/A;   TONSILLECTOMY       Current Facility-Administered Medications:    acetaminophen (TYLENOL) tablet 650 mg, 650 mg, Oral, Q6H PRN **OR** acetaminophen (TYLENOL) suppository 650 mg, 650 mg, Rectal, Q6H PRN, Cox, Amy N, DO   ciprofloxacin (CIPRO) IVPB 400 mg, 400 mg, Intravenous, Q12H, Sharen Hones, MD, Last Rate: 200 mL/hr at 05/12/22 0935, 400 mg at 05/12/22 0935   LORazepam (ATIVAN) tablet 0.5 mg, 0.5 mg, Oral, Q6H PRN, Cox, Amy N, DO   melatonin tablet 5 mg, 5 mg, Oral, QHS PRN, Cox, Amy N, DO   ondansetron (ZOFRAN) tablet 4 mg, 4 mg, Oral, Q6H PRN **OR** ondansetron (ZOFRAN) injection 4 mg, 4 mg, Intravenous, Q6H PRN, Cox, Amy N, DO   [START ON 05/15/2022] pantoprazole (PROTONIX) injection 40 mg, 40 mg, Intravenous, Q12H, Cox, Amy N, DO   pantoprozole (PROTONIX) 80 mg /NS 100 mL infusion, 8 mg/hr, Intravenous, Continuous, Cox, Amy N, DO, Last Rate: 10 mL/hr at 05/12/22 0519, 8 mg/hr at 05/12/22 0519   senna-docusate (Senokot-S) tablet 1 tablet, 1 tablet, Oral, QHS PRN, Cox, Amy N, DO   History reviewed. No pertinent family history.   Social History   Tobacco Use   Smoking status: Never   Smokeless tobacco: Never  Vaping Use   Vaping Use: Never used  Substance Use Topics   Alcohol use: Not Currently    Comment: 3-4 drinks per year on vacation   Drug use:  Never    Allergies as of 05/11/2022   (No Known Allergies)    Review of Systems:    All systems reviewed and negative except where noted in HPI.   Physical Exam:  Vital signs in last 24 hours: Temp:  [97 F (36.1 C)-98.3 F (36.8 C)] 98.2 F (36.8 C) (03/09 1140) Pulse Rate:  [70-79] 78 (03/09 1140) Resp:  [16-25] 17 (03/09 1140) BP: (98-125)/(63-75) 118/69 (03/09 1140) SpO2:  [96 %-100 %] 100 % (03/09 1140) Weight:  [96 kg] 96 kg (03/08 1745) Last BM Date : 05/12/22 General:   Pleasant, cooperative in NAD Head:  Normocephalic and atraumatic. Eyes:   No icterus.   Conjunctiva pink. PERRLA. Ears:   Normal auditory acuity. Neck:  Supple; no masses or thyroidomegaly Lungs: Respirations even and unlabored. Lungs clear to auscultation bilaterally.   No wheezes, crackles, or rhonchi.  Heart:  Regular rate and rhythm;  Without murmur, clicks, rubs or gallops Abdomen:  Soft, nondistended, nontender. Normal bowel sounds. No appreciable masses or hepatomegaly.  No rebound or guarding.  Rectal:  Not performed. Msk:  Symmetrical without gross deformities.  Strength generalized weakness Extremities:  Without edema, cyanosis or clubbing. Neurologic:  Alert and oriented x3;  grossly normal neurologically. Skin:  Intact without significant lesions or rashes. Psych:  Alert and cooperative. Normal affect.  LAB RESULTS:    Latest Ref Rng & Units 05/12/2022   12:26 PM 05/12/2022    4:23 AM 05/11/2022    1:48 PM  CBC  WBC 4.0 - 10.5 K/uL  7.6  12.0   Hemoglobin 12.0 - 15.0 g/dL 8.7  7.8  10.0   Hematocrit 36.0 - 46.0 %  23.1  30.0   Platelets 150 - 400 K/uL  240  341     BMET    Latest Ref Rng & Units 05/12/2022    4:23 AM 05/11/2022    1:48 PM  BMP  Glucose 70 - 99 mg/dL 100  124   BUN 8 - 23 mg/dL 21  23   Creatinine 0.44 - 1.00 mg/dL 0.66  0.78   Sodium 135 - 145 mmol/L 140  138   Potassium 3.5 - 5.1 mmol/L 3.6  3.5   Chloride 98 - 111 mmol/L 108  106   CO2 22 - 32 mmol/L 25  24   Calcium 8.9 - 10.3 mg/dL 8.4  9.4     LFT    Latest Ref Rng & Units 05/11/2022    1:48 PM  Hepatic Function  Total Protein 6.5 - 8.1 g/dL 6.5   Albumin 3.5 - 5.0 g/dL 3.6   AST 15 - 41 U/L 21   ALT 0 - 44 U/L 14   Alk Phosphatase 38 - 126 U/L 62   Total Bilirubin 0.3 - 1.2 mg/dL 0.4      STUDIES: CT ABDOMEN PELVIS W CONTRAST  Result Date: 05/11/2022 CLINICAL DATA:  Left lower quadrant abdominal pain and rectal bleeding EXAM: CT ABDOMEN AND PELVIS WITH CONTRAST TECHNIQUE: Multidetector CT imaging of the abdomen and pelvis was performed using the standard protocol following bolus administration of  intravenous contrast. RADIATION DOSE REDUCTION: This exam was performed according to the departmental dose-optimization program which includes automated exposure control, adjustment of the mA and/or kV according to patient size and/or use of iterative reconstruction technique. CONTRAST:  135m OMNIPAQUE IOHEXOL 300 MG/ML  SOLN COMPARISON:  None Available. FINDINGS: Lower chest: Subsegmental atelectasis in both lower lobes. Small type 1 hiatal hernia. Mild descending thoracic  aortic atherosclerotic vascular calcification. Hepatobiliary: Multiple gallstones in the gallbladder containing nitrogen gas. No significant hepatic parenchymal lesion is identified. No biliary dilatation. Pancreas: Unremarkable Spleen: Unremarkable Adrenals/Urinary Tract: Unremarkable Stomach/Bowel: Redundant sigmoid colon. Air fluid levels in the descending colon and proximal sigmoid colon raising the possibility of diarrheal process. Vascular/Lymphatic: Atherosclerosis is present, including aortoiliac atherosclerotic disease. Reproductive: Unremarkable Other: No supplemental non-categorized findings. Musculoskeletal: Levoconvex lumbar scoliosis. Lower lumbar spondylosis and degenerative disc disease with mild left foraminal impingement at L4-5 and suspected mild right foraminal impingement at L1-2 and L3-4. IMPRESSION: 1. Air-fluid levels in the descending colon and proximal sigmoid colon raising the possibility of diarrheal process. 2. Redundant sigmoid colon extends up into the left upper quadrant, without dilated bowel or indicators of sigmoid volvulus. 3. Cholelithiasis. 4. Small type 1 hiatal hernia. 5. Levoconvex lumbar scoliosis and spondylosis with mild impingement at several levels. 6. Aortic atherosclerosis. 7. Subsegmental atelectasis in both lower lobes. Aortic Atherosclerosis (ICD10-I70.0). Electronically Signed   By: Van Clines M.D.   On: 05/11/2022 15:17      Impression / Plan:   Mary Bennett is a 71 y.o.  female with with no significant past medical history is admitted with melena and diarrhea.  Stool studies came back positive for E. coli  Melena with acute blood loss anemia Continue IV pantoprazole Recommend EGD for further evaluation Start clear liquid diet today Monitor CBC closely Check iron panel, B12 and folate levels N.p.o. effective 5 AM tomorrow in order to proceed with upper endoscopy  Diarrhea secondary to E. Coli Started on Cipro, recommend 200 mg twice daily for 5 days  I have discussed alternative options, risks & benefits,  which include, but are not limited to, bleeding, infection, perforation,respiratory complication & drug reaction.  The patient agrees with this plan & written consent will be obtained.     Thank you for involving me in the care of this patient.      LOS: 1 day   Sherri Sear, MD  05/12/2022, 1:44 PM    Note: This dictation was prepared with Dragon dictation along with smaller phrase technology. Any transcriptional errors that result from this process are unintentional.

## 2022-05-13 ENCOUNTER — Inpatient Hospital Stay: Payer: Medicare Other | Admitting: Anesthesiology

## 2022-05-13 ENCOUNTER — Encounter
Admission: EM | Disposition: A | Discharge status: Home / Self Care | Payer: Self-pay | Source: Home / Self Care | Attending: Internal Medicine

## 2022-05-13 ENCOUNTER — Encounter: Payer: Self-pay | Admitting: Internal Medicine

## 2022-05-13 HISTORY — PX: ESOPHAGOGASTRODUODENOSCOPY (EGD) WITH PROPOFOL: SHX5813

## 2022-05-13 LAB — CBC
HCT: 24.2 % — ABNORMAL LOW (ref 36.0–46.0)
Hemoglobin: 8 g/dL — ABNORMAL LOW (ref 12.0–15.0)
MCH: 29.9 pg (ref 26.0–34.0)
MCHC: 33.1 g/dL (ref 30.0–36.0)
MCV: 90.3 fL (ref 80.0–100.0)
Platelets: 252 10*3/uL (ref 150–400)
RBC: 2.68 MIL/uL — ABNORMAL LOW (ref 3.87–5.11)
RDW: 12.9 % (ref 11.5–15.5)
WBC: 7.4 10*3/uL (ref 4.0–10.5)
nRBC: 0 % (ref 0.0–0.2)

## 2022-05-13 LAB — BASIC METABOLIC PANEL
Anion gap: 5 (ref 5–15)
BUN: 16 mg/dL (ref 8–23)
CO2: 26 mmol/L (ref 22–32)
Calcium: 8.5 mg/dL — ABNORMAL LOW (ref 8.9–10.3)
Chloride: 109 mmol/L (ref 98–111)
Creatinine, Ser: 0.82 mg/dL (ref 0.44–1.00)
GFR, Estimated: >60 mL/min (ref >60)
Glucose, Bld: 102 mg/dL — ABNORMAL HIGH (ref 70–99)
Potassium: 3.5 mmol/L (ref 3.5–5.1)
Sodium: 140 mmol/L (ref 135–145)

## 2022-05-13 LAB — HEMOGLOBIN
Hemoglobin: 7.9 g/dL — ABNORMAL LOW (ref 12.0–15.0)
Hemoglobin: 8.6 g/dL — ABNORMAL LOW (ref 12.0–15.0)

## 2022-05-13 LAB — MAGNESIUM: Magnesium: 2 mg/dL (ref 1.7–2.4)

## 2022-05-13 SURGERY — ESOPHAGOGASTRODUODENOSCOPY (EGD) WITH PROPOFOL
Anesthesia: General

## 2022-05-13 MED ORDER — VITAMIN B-12 1000 MCG PO TABS
1000.0000 ug | ORAL_TABLET | Freq: Every day | ORAL | Status: DC
  Administered 2022-05-13 – 2022-05-14 (×2): 1000 ug via ORAL
  Filled 2022-05-13 (×3): qty 1

## 2022-05-13 MED ORDER — PROPOFOL 1000 MG/100ML IV EMUL
INTRAVENOUS | Status: AC
  Filled 2022-05-13: qty 100

## 2022-05-13 MED ORDER — LIDOCAINE HCL (PF) 2 % IJ SOLN
INTRAMUSCULAR | Status: AC
  Filled 2022-05-13: qty 5

## 2022-05-13 MED ORDER — CIPROFLOXACIN IN D5W 200 MG/100ML IV SOLN
200.0000 mg | Freq: Two times a day (BID) | INTRAVENOUS | Status: DC
  Filled 2022-05-13 (×2): qty 100

## 2022-05-13 MED ORDER — LIDOCAINE HCL (CARDIAC) PF 100 MG/5ML IV SOSY
PREFILLED_SYRINGE | INTRAVENOUS | Status: DC | PRN
  Administered 2022-05-13: 100 mg via INTRAVENOUS

## 2022-05-13 MED ORDER — SODIUM CHLORIDE 0.9 % IV SOLN
250.0000 mg | Freq: Once | INTRAVENOUS | Status: AC
  Administered 2022-05-13: 250 mg via INTRAVENOUS
  Filled 2022-05-13: qty 20

## 2022-05-13 MED ORDER — SODIUM CHLORIDE 0.9 % IV SOLN: INTRAVENOUS | Status: DC

## 2022-05-13 MED ORDER — CIPROFLOXACIN HCL 500 MG PO TABS
250.0000 mg | ORAL_TABLET | Freq: Two times a day (BID) | ORAL | Status: DC
  Administered 2022-05-13 – 2022-05-14 (×2): 250 mg via ORAL
  Filled 2022-05-13 (×2): qty 1

## 2022-05-13 MED ORDER — SODIUM CHLORIDE 0.9 % IV SOLN: INTRAVENOUS | Status: DC | PRN

## 2022-05-13 MED ORDER — PROPOFOL 500 MG/50ML IV EMUL
INTRAVENOUS | Status: DC | PRN
  Administered 2022-05-13 (×2): 20 mg via INTRAVENOUS
  Administered 2022-05-13: 100 mg via INTRAVENOUS

## 2022-05-13 NOTE — Anesthesia Preprocedure Evaluation (Signed)
Anesthesia Evaluation  Patient identified by MRN, date of birth, ID band Patient awake    Reviewed: Allergy & Precautions, H&P , NPO status , Patient's Chart, lab work & pertinent test results, reviewed documented beta blocker date and time   History of Anesthesia Complications Negative for: history of anesthetic complications  Airway Mallampati: II   Neck ROM: full    Dental  (+) Poor Dentition   Pulmonary neg pulmonary ROS   Pulmonary exam normal        Cardiovascular negative cardio ROS Normal cardiovascular exam Rhythm:regular Rate:Normal     Neuro/Psych negative neurological ROS  negative psych ROS   GI/Hepatic negative GI ROS, Neg liver ROS,,,  Endo/Other  negative endocrine ROS    Renal/GU negative Renal ROS  negative genitourinary   Musculoskeletal   Abdominal   Peds  Hematology negative hematology ROS (+)   Anesthesia Other Findings Past Medical History: 08/14/2006: Dysplastic nevus     Comment:  R mid back - mild  08/14/2007: Dysplastic nevus     Comment:  L lower med leg - mild  11/24/2008: Dysplastic nevus     Comment:  L mid back - mild  Past Surgical History: No date: CESAREAN SECTION No date: TONSILLECTOMY BMI    Body Mass Index: 31.77 kg/m     Reproductive/Obstetrics negative OB ROS                             Anesthesia Physical Anesthesia Plan  ASA: 2  Anesthesia Plan: General   Post-op Pain Management:    Induction: Intravenous  PONV Risk Score and Plan: 3 and Propofol infusion and TIVA  Airway Management Planned: Natural Airway and Nasal Cannula  Additional Equipment:   Intra-op Plan:   Post-operative Plan:   Informed Consent: I have reviewed the patients History and Physical, chart, labs and discussed the procedure including the risks, benefits and alternatives for the proposed anesthesia with the patient or authorized representative who has  indicated his/her understanding and acceptance.     Dental Advisory Given  Plan Discussed with: CRNA  Anesthesia Plan Comments:         Anesthesia Quick Evaluation

## 2022-05-13 NOTE — Transfer of Care (Signed)
Immediate Anesthesia Transfer of Care Note  Patient: Mary Bennett  Procedure(s) Performed: ESOPHAGOGASTRODUODENOSCOPY (EGD) WITH PROPOFOL  Patient Location: Endoscopy Unit  Anesthesia Type:MAC  Level of Consciousness: awake, alert , oriented, and patient cooperative  Airway & Oxygen Therapy: Patient Spontanous Breathing  Post-op Assessment: Report given to RN and Patient moving all extremities X 4  Post vital signs: stable  Last Vitals:  Vitals Value Taken Time  BP 92/60 05/13/22 0844  Temp 36.4 C 05/13/22 0844  Pulse 78 05/13/22 0845  Resp 13 05/13/22 0845  SpO2 94 % 05/13/22 0845  Vitals shown include unvalidated device data.  Last Pain:  Vitals:   05/13/22 0844  TempSrc: Temporal  PainSc: 0-No pain         Complications: No notable events documented.

## 2022-05-13 NOTE — Progress Notes (Signed)
  Progress Note   Patient: Mary Bennett JJO:[id number] DOB: 27-May-1950 DOA: 05/11/2022     2 DOS: the patient was seen and examined on 05/13/2022   Brief hospital course: Ms. Mary Bennett is a 72 year old female with dysplastic nevus who presents emergency department for chief concerns of diarrhea that is black in color. Patient ate grilled chicken prior to admission. Patient was placed on PPI, consult from GI is obtained.  Hemoglobin dropped down to 7.8 on 3/9, patient was giving IV iron.  B12 level borderline at 208, received B12 injection while pending homocystine level. Stool study came back with enteropathogenic E. 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 EGD was performed on 3/10, showed erosive gastritis.  Capsule endoscopy will be scheduled as outpatient.   Principal Problem:   Upper GI bleed Active Problems:   Rectal bleed   Normocytic anemia   Leukocytosis   Hypotension   Aortic atherosclerosis (HCC)   Acute blood loss anemia   E coli enteritis   Obesity (BMI 30-39.9)   Melena   Assessment and Plan:  GI bleed Acute blood loss anemia. E. coli enteritis. Transient hypotension secondary to dehydration. Patient still has some black loose stool today, but is slowing down.  This is difficult to tell if patient has upper GI bleeding versus bleeding from the colon.  PPI will be continued. I also started 3 days of Cipro. Patient also received IV iron x2, B12 level borderline, give B12 injection and by oral.  Pending homocystine level. Had transient hypotension at time admission, blood pressure stabilized after giving fluids. Patient hemoglobin still trending down, will hold off discharge for now.  May be able to discharge tomorrow if hemoglobin is better.  Aortic atherosclerosis ([place]) Follow-up with PCP as outpatient.     Subjective:  Patient doing much better today, she had a 1 solid black stool.  No nausea vomiting abdominal  pain.  Physical Exam: Vitals:   05/13/22 0844 05/13/22 0854 05/13/22 0904 05/13/22 0951  BP: 92/60 107/76 115/70 130/72  Pulse: 76 68 72 75  Resp: 18 12 13 18   Temp: 97.6 F (36.4 C)   97.8 F (36.6 C)  TempSrc: Temporal     SpO2: 98% 96% 100% 100%  Weight:      Height:       General exam: Appears calm and comfortable  Respiratory system: Clear to auscultation. Respiratory effort normal. Cardiovascular system: S1 & S2 heard, RRR. No JVD, murmurs, rubs, gallops or clicks. No pedal edema. Gastrointestinal system: Abdomen is nondistended, soft and nontender. No organomegaly or masses felt. Normal bowel sounds heard. Central nervous system: Alert and oriented. No focal neurological deficits. Extremities: Symmetric 5 x 5 power. Skin: No rashes, lesions or ulcers Psychiatry: Judgement and insight appear normal. Mood & affect appropriate.    Data Reviewed:  Lab results reviewed.  Family Communication: None  Disposition: Status is: Inpatient Remains inpatient appropriate because: Severity of disease,     Time spent: 35 minutes  Author: Sharen Hones, MD 05/13/2022 2:16 PM  For on call review [email or web address].

## 2022-05-13 NOTE — Anesthesia Postprocedure Evaluation (Signed)
Anesthesia Post Note  Patient: Mary Bennett  Procedure(s) Performed: ESOPHAGOGASTRODUODENOSCOPY (EGD) WITH PROPOFOL  Patient location during evaluation: Endoscopy Anesthesia Type: General Level of consciousness: awake and alert Pain management: pain level controlled Vital Signs Assessment: post-procedure vital signs reviewed and stable Respiratory status: spontaneous breathing, nonlabored ventilation, respiratory function stable and patient connected to nasal cannula oxygen Cardiovascular status: blood pressure returned to baseline and stable Postop Assessment: no apparent nausea or vomiting Anesthetic complications: no   No notable events documented.   Last Vitals:  Vitals:   05/13/22 0453 05/13/22 0844  BP: 103/64 92/60  Pulse: 76 76  Resp: 18 18  Temp: 36.6 C 36.4 C  SpO2: 99% 92%    Last Pain:  Vitals:   05/13/22 0844  TempSrc: Temporal  PainSc: 0-No pain                 Martha Clan

## 2022-05-13 NOTE — Op Note (Signed)
[facility] Patient Name: Mary Bennett Procedure Date: 05/13/2022 8:16 AM MRN: [id number] Account #: [id number] Date of Birth: November 14, 1950 Admit Type: Inpatient Age: 72 Room: [facility] ENDO ROOM 4 Gender: Female Note Status: Finalized Instrument Name: Upper Endoscope [id number] Procedure:             Upper GI endoscopy Indications:           Acute post hemorrhagic anemia, Melena Providers:             Lin Landsman MD, MD Referring MD:          Caprice Renshaw MD (Referring MD) Medicines:             General Anesthesia Complications:         No immediate complications. Estimated blood loss: None. Procedure:             Pre-Anesthesia Assessment:                        - Prior to the procedure, a History and Physical was                         performed, and patient medications and allergies were                         reviewed. The patient is competent. The risks and                         benefits of the procedure and the sedation options and                         risks were discussed with the patient. All questions                         were answered and informed consent was obtained.                         Patient identification and proposed procedure were                         verified by the physician, the nurse, the                         anesthesiologist, the anesthetist and the technician                         in the pre-procedure area in the procedure room in the                         endoscopy suite. Mental Status Examination: alert and                         oriented. Airway Examination: normal oropharyngeal                         airway and neck mobility. Respiratory Examination:                         clear to auscultation. CV Examination: normal.  Prophylactic Antibiotics: The patient does not require                         prophylactic antibiotics. Prior Anticoagulants: The                          patient has taken no anticoagulant or antiplatelet                         agents. ASA Grade Assessment: II - A patient with mild                         systemic disease. After reviewing the risks and                         benefits, the patient was deemed in satisfactory                         condition to undergo the procedure. The anesthesia                         plan was to use general anesthesia. Immediately prior                         to administration of medications, the patient was                         re-assessed for adequacy to receive sedatives. The                         heart rate, respiratory rate, oxygen saturations,                         blood pressure, adequacy of pulmonary ventilation, and                         response to care were monitored throughout the                         procedure. The physical status of the patient was                         re-assessed after the procedure.                        After obtaining informed consent, the endoscope was                         passed under direct vision. Throughout the procedure,                         the patient's blood pressure, pulse, and oxygen                         saturations were monitored continuously. The Endoscope                         was introduced through the mouth, and advanced to the  second part of duodenum. The upper GI endoscopy was                         accomplished without difficulty. The patient tolerated                         the procedure well. Findings:      The duodenal bulb and second portion of the duodenum were normal.      A few dispersed medium erosions with no bleeding and no stigmata of       recent bleeding were found in the gastric antrum. Biopsies were taken       with a cold forceps for Helicobacter pylori testing.      The gastric body and incisura were normal. Biopsies were taken with a       cold forceps for Helicobacter  pylori testing.      The cardia and gastric fundus were normal on retroflexion.      Esophagogastric landmarks were identified: the gastroesophageal junction       was found at 40 cm from the incisors.      The gastroesophageal junction and examined esophagus were normal. Impression:            - Normal duodenal bulb and second portion of the                         duodenum.                        - Erosive gastropathy with no bleeding and no stigmata                         of recent bleeding. Biopsied.                        - Normal gastric body and incisura. Biopsied.                        - Esophagogastric landmarks identified.                        - Normal gastroesophageal junction and esophagus. Recommendation:        - Await pathology results.                        - Return patient to hospital ward for possible                         discharge same day.                        - Resume regular diet today.                        - Continue present medications.                        - To visualize the small bowel, perform video capsule                         endoscopy at appointment to be scheduled as outpt . Procedure Code(s):     ---  Professional ---                        [phone], Esophagogastroduodenoscopy, flexible,                         transoral; with biopsy, single or multiple Diagnosis Code(s):     --- Professional ---                        K31.89, Other diseases of stomach and duodenum                        D62, Acute posthemorrhagic anemia                        K92.1, Melena (includes Hematochezia) CPT copyright 2022 [employer]. All rights reserved. The codes documented in this report are preliminary and upon coder review may  be revised to meet current compliance requirements. Dr. Ulyess Mort Lin Landsman MD, MD 05/13/2022 8:42:03 AM This report has been signed electronically. Number of Addenda: 0 Note Initiated On: 05/13/2022 8:16  AM Estimated Blood Loss:  Estimated blood loss: none.      [facility]

## 2022-05-13 NOTE — Progress Notes (Signed)
Mobility Specialist - Progress Note   05/13/22 1032  Mobility  Activity Ambulated independently in hallway;Stood at bedside;Dangled on edge of bed  Level of Assistance Independent  Assistive Device None  Distance Ambulated (ft) 330 ft  Activity Response Tolerated well  Mobility Referral Yes  $Mobility charge 1 Mobility   Pt supine in bed on RA upon arrival. Pt STS and ambulates in hallway indep with no LOB noted. Pt returns to recliner with needs in reach.   Gretchen Short  Mobility Specialist  05/13/22 10:33 AM

## 2022-05-13 NOTE — Progress Notes (Signed)
GI postprocedure note:  Her EGD showed only gastric erosions which does not explain melena. Resumed her diet. I think she can be discharged home later today if her hemoglobin is stable on repeat check. She needs video capsule endoscopy as outpatient, she will follow-up with Dr. Haig Prophet who is her primary gastroenterologist. I also ordered another dose of IV iron for today. Please discharge her on Protonix 40 mg p.o. twice daily for 1 month    Sherri Sear, MD

## 2022-05-13 NOTE — Anesthesia Postprocedure Evaluation (Deleted)
Anesthesia Post Note  Patient: Mary Bennett  Procedure(s) Performed: ESOPHAGOGASTRODUODENOSCOPY (EGD) WITH PROPOFOL  Anesthesia Type: General Anesthetic complications: no   No notable events documented.   Last Vitals:  Vitals:   05/13/22 0453 05/13/22 0844  BP: 103/64 92/60  Pulse: 76 76  Resp: 18 18  Temp: 36.6 C 36.4 C  SpO2: 99% 92%    Last Pain:  Vitals:   05/13/22 0844  TempSrc: Temporal  PainSc: 0-No pain                 Claiborne Billings Herringdine

## 2022-05-14 ENCOUNTER — Telehealth: Payer: Self-pay

## 2022-05-14 ENCOUNTER — Other Ambulatory Visit: Payer: Self-pay

## 2022-05-14 ENCOUNTER — Encounter: Payer: Self-pay | Admitting: Gastroenterology

## 2022-05-14 DIAGNOSIS — D649 Anemia, unspecified: Secondary | ICD-10-CM

## 2022-05-14 LAB — HEMOGLOBIN: Hemoglobin: 7.5 g/dL — ABNORMAL LOW (ref 12.0–15.0)

## 2022-05-14 LAB — HOMOCYSTEINE: Homocysteine: 14.4 umol/L (ref 0.0–17.2)

## 2022-05-14 MED ORDER — SODIUM CHLORIDE 0.9 % IV SOLN
250.0000 mg | Freq: Once | INTRAVENOUS | Status: DC
  Filled 2022-05-14: qty 20

## 2022-05-14 MED ORDER — SODIUM CHLORIDE 0.9 % IV SOLN
300.0000 mg | Freq: Once | INTRAVENOUS | Status: AC
  Administered 2022-05-14: 300 mg via INTRAVENOUS
  Filled 2022-05-14: qty 300

## 2022-05-14 MED ORDER — PANTOPRAZOLE SODIUM 40 MG PO TBEC: 40.0000 mg | DELAYED_RELEASE_TABLET | Freq: Two times a day (BID) | ORAL | 2 refills | Status: AC

## 2022-05-14 MED ORDER — CIPROFLOXACIN HCL 250 MG PO TABS: 250.0000 mg | ORAL_TABLET | Freq: Two times a day (BID) | ORAL | 0 refills | Status: AC

## 2022-05-14 MED ORDER — CYANOCOBALAMIN 1000 MCG PO TABS: 1000.0000 ug | ORAL_TABLET | Freq: Every day | ORAL | 0 refills | Status: AC

## 2022-05-14 NOTE — Telephone Encounter (Signed)
Per EGD report patient needs to schedule a capsule study

## 2022-05-14 NOTE — Discharge Summary (Signed)
Physician Discharge Summary  Mary Bennett [id number] DOB: March 07, 1950 DOA: 05/11/2022  PCP: Derinda Late, MD  Admit date: 05/11/2022  Discharge date: 05/14/2022  Admitted From: Home.  Disposition: Home  Recommendations for Outpatient Follow-up:  Follow up with PCP in 1-2 weeks. Please obtain BMP/CBC in one week. Advised to follow-up with GI Dr. Haig Prophet in 2 weeks for capsule endoscopy. Advised to take pantoprazole 40 mg every 12 hours for the next 3 months. Advised to take ciprofloxacin 250 mg twice daily for 2 more days.  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 Patient ate grilled chicken prior to admission.  Patient was admitted for further evaluation.  Patient was placed on PPI, consult from GI is obtained.  Hemoglobin dropped down to 7.8 on 3/9, patient was given IV iron.  B12 level borderline at 208, received B12 injection while pending homocystine level. Stool study came back with enteropathogenic E. 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 EGD was performed on 3/10, showed erosive gastritis.  Capsule endoscopy will be scheduled as outpatient.  Patient was cleared from GI to be discharged.  Patient had 1 episode of black stools after having endoscopy.  Hemoglobin remained stable.  Patient feels much improved, denies any dizziness, tiredness or further episodes of black stools.  She wants to be discharged.  Patient is being discharged home after IV iron infusion.  Patient will follow-up with GI for capsule endoscopy.  Discharge Diagnoses:  Principal Problem:   Upper GI bleed Active Problems:   Rectal bleed   Normocytic anemia   Leukocytosis   Hypotension    Aortic atherosclerosis (HCC)   Acute blood loss anemia   E coli enteritis   Obesity (BMI 30-39.9)   Melena  Acute blood loss anemia secondary to GI bleeding: E. coli enteritis. Transient hypotension secondary to dehydration: Patient underwent EGD found to have erosive gastritis.  GI started pantoprazole twice a day for next 3 months.  Continue ciprofloxacin twice daily for 3 days for invasive E. coli gastritis. Patient also received IV iron x 2, B12 level borderline, give B12 injection and by oral.  Pending homocystine level. Had transient hypotension at time admission, blood pressure stabilized after giving fluids. Patient hemoglobin remained stable.  Denies any dizziness tiredness.  Patient wants to be discharged.   Patient being discharged home after adequate iron infusion.   Aortic atherosclerosis ([place]) Follow-up with PCP as outpatient.      Discharge Instructions  Discharge Instructions     Call MD for:  persistant dizziness or light-headedness   Complete by: As directed    Call MD for:  persistant nausea and vomiting   Complete by: As directed    Diet - low sodium heart healthy   Complete by: As directed    Diet Carb Modified   Complete by: As directed    Diet regular   Complete by: As directed    Discharge instructions   Complete by: As directed    Advised to follow up PCP in one week. Advised to follow-up with GI Dr. Haig Prophet in 2 weeks for capsule endoscopy. Advised to take pantoprazole 40 mg every 12 hours for the next 3 months. Advised to take ciprofloxacin to 50 mg twice daily for 2  more days.   Increase activity slowly   Complete by: As directed       Allergies as of 05/14/2022   No Known Allergies      Medication List     TAKE these medications    ciprofloxacin 250 MG tablet Commonly known as: CIPRO Take 1 tablet (250 mg total) by mouth 2 (two) times daily for 2 days.   cyanocobalamin 1000 MCG tablet Take 1 tablet (1,000 mcg total) by mouth  daily. Start taking on: May 15, 2022   pantoprazole 40 MG tablet Commonly known as: Protonix Take 1 tablet (40 mg total) by mouth 2 (two) times daily.        Follow-up Information     Derinda Late, MD Follow up in 1 week(s).   Specialty: Family Medicine Why: Dr. Elzie Rings office will call you directly to schedule to schedule a followup visit next week.  Please call them back if you haven't heard from them by tomorrow afternoon.  [phone] Contact information: [address] [facility] [place] [address] [phone]         Lesly Rubenstein, MD Follow up in 2 week(s).   Specialty: Gastroenterology Why: Dr. Drinda Butts office will contact you directly to schedule.  Please give them a call if you haven't heard anything in 1-2 days.  [phone]  Capsule endoscopy Contact information: [place] [address] [phone]                No Known Allergies  Consultations: Gastroenterology   Procedures/Studies: CT ABDOMEN PELVIS W CONTRAST  Result Date: 05/11/2022 CLINICAL DATA:  Left lower quadrant abdominal pain and rectal bleeding EXAM: CT ABDOMEN AND PELVIS WITH CONTRAST TECHNIQUE: Multidetector CT imaging of the abdomen and pelvis was performed using the standard protocol following bolus administration of intravenous contrast. RADIATION DOSE REDUCTION: This exam was performed according to the departmental dose-optimization program which includes automated exposure control, adjustment of the mA and/or kV according to patient size and/or use of iterative reconstruction technique. CONTRAST:  138m OMNIPAQUE IOHEXOL 300 MG/ML  SOLN COMPARISON:  None Available. FINDINGS: Lower chest: Subsegmental atelectasis in both lower lobes. Small type 1 hiatal hernia. Mild descending thoracic aortic atherosclerotic vascular calcification. Hepatobiliary: Multiple gallstones in the gallbladder containing  nitrogen gas. No significant hepatic parenchymal lesion is identified. No biliary dilatation. Pancreas: Unremarkable Spleen: Unremarkable Adrenals/Urinary Tract: Unremarkable Stomach/Bowel: Redundant sigmoid colon. Air fluid levels in the descending colon and proximal sigmoid colon raising the possibility of diarrheal process. Vascular/Lymphatic: Atherosclerosis is present, including aortoiliac atherosclerotic disease. Reproductive: Unremarkable Other: No supplemental non-categorized findings. Musculoskeletal: Levoconvex lumbar scoliosis. Lower lumbar spondylosis and degenerative disc disease with mild left foraminal impingement at L4-5 and suspected mild right foraminal impingement at L1-2 and L3-4. IMPRESSION: 1. Air-fluid levels in the descending colon and proximal sigmoid colon raising the possibility of diarrheal process. 2. Redundant sigmoid colon extends up into the left upper quadrant, without dilated bowel or indicators of sigmoid volvulus. 3. Cholelithiasis. 4. Small type 1 hiatal hernia. 5. Levoconvex lumbar scoliosis and spondylosis with mild impingement at several levels. 6. Aortic atherosclerosis. 7. Subsegmental atelectasis in both lower lobes. Aortic Atherosclerosis (ICD10-I70.0). Electronically Signed   By: WVan ClinesM.D.   On: 05/11/2022 15:17      Subjective: Patient was seen and examined at bedside.  Overnight events noted.   Patient reports doing much better and wants to be discharged.   Patient is being  discharged home after IV iron infusion.   She denies any further episodes of black stools.  Discharge Exam: Vitals:   05/14/22 0448 05/14/22 0720  BP: 109/63 128/80  Pulse: 87 78  Resp:  18  Temp: 97.8 F (36.6 C) 98 F (36.7 C)  SpO2: 93% 98%   Vitals:   05/13/22 1724 05/13/22 2007 05/14/22 0448 05/14/22 0720  BP: 137/80 126/75 109/63 128/80  Pulse: 80 79 87 78  Resp: '18 18  18  '$ Temp: 97.9 F (36.6 C) 98.6 F (37 C) 97.8 F (36.6 C) 98 F (36.7 C)   TempSrc: Oral  Oral Oral  SpO2: 100% 100% 93% 98%  Weight:      Height:        General: Pt is alert, awake, not in acute distress Cardiovascular: RRR, S1/S2 +, no rubs, no gallops Respiratory: CTA bilaterally, no wheezing, no rhonchi Abdominal: Soft, NT, ND, bowel sounds + Extremities: no edema, no cyanosis    The results of significant diagnostics from this hospitalization (including imaging, microbiology, ancillary and laboratory) are listed below for reference.     Microbiology: Recent Results (from the past 240 hour(s))  Gastrointestinal Panel by PCR , Stool     Status: Abnormal   Collection Time: 05/11/22 10:10 PM   Specimen: Stool  Result Value Ref Range Status   Campylobacter species NOT DETECTED NOT DETECTED Final   Plesimonas shigelloides NOT DETECTED NOT DETECTED Final   Salmonella species NOT DETECTED NOT DETECTED Final   Yersinia enterocolitica NOT DETECTED NOT DETECTED Final   Vibrio species NOT DETECTED NOT DETECTED Final   Vibrio cholerae NOT DETECTED NOT DETECTED Final   Enteroaggregative E coli (EAEC) NOT DETECTED NOT DETECTED Final   Enteropathogenic E coli (EPEC) DETECTED (A) NOT DETECTED Final    Comment: RESULT CALLED TO, READ BACK BY AND VERIFIED WITH: LOUANN HUMPRIES 05/12/2022 AT 0117 SRR    Enterotoxigenic E coli (ETEC) NOT DETECTED NOT DETECTED Final   Shiga like toxin producing E coli (STEC) NOT DETECTED NOT DETECTED Final   Shigella/Enteroinvasive E coli (EIEC) NOT DETECTED NOT DETECTED Final   Cryptosporidium NOT DETECTED NOT DETECTED Final   Cyclospora cayetanensis NOT DETECTED NOT DETECTED Final   Entamoeba histolytica NOT DETECTED NOT DETECTED Final   Giardia lamblia NOT DETECTED NOT DETECTED Final   Adenovirus F40/41 NOT DETECTED NOT DETECTED Final   Astrovirus NOT DETECTED NOT DETECTED Final   Norovirus GI/GII NOT DETECTED NOT DETECTED Final   Rotavirus A NOT DETECTED NOT DETECTED Final   Sapovirus (I, II, IV, and V) NOT DETECTED NOT  DETECTED Final    Comment: Performed at [facility], [address] [place], [place] [address]  C Difficile Quick Screen w PCR reflex     Status: None   Collection Time: 05/11/22 10:10 PM   Specimen: STOOL  Result Value Ref Range Status   C Diff antigen NEGATIVE NEGATIVE Final   C Diff toxin NEGATIVE NEGATIVE Final   C Diff interpretation No C. difficile detected.  Final    Comment: Performed at [facility], [address] [place], [place] [address]     Labs: BNP (last 3 results) No results for input(s): "BNP" in the last 8760 hours. Basic Metabolic Panel: Recent Labs  Lab 05/11/22 1348 05/12/22 0423 05/13/22 0528  NA 138 140 140  K 3.5 3.6 3.5  CL 106 108 109  CO2 '24 25 26  '$ GLUCOSE 124* 100* 102*  BUN '23 21 16  '$ CREATININE 0.78  0.66 0.82  CALCIUM 9.4 8.4* 8.5*  MG  --   --  2.0   Liver Function Tests: Recent Labs  Lab 05/11/22 1348  AST 21  ALT 14  ALKPHOS 62  BILITOT 0.4  PROT 6.5  ALBUMIN 3.6   Recent Labs  Lab 05/11/22 1348  LIPASE 35   No results for input(s): "AMMONIA" in the last 168 hours. CBC: Recent Labs  Lab 05/11/22 1348 05/12/22 0423 05/12/22 1226 05/12/22 1937 05/13/22 0528 05/13/22 1143 05/13/22 1728 05/14/22 0422  WBC 12.0* 7.6  --   --  7.4  --   --   --   HGB 10.0* 7.8*   < > 8.8* 8.0* 7.9* 8.6* 7.5*  HCT 30.0* 23.1*  --   --  24.2*  --   --   --   MCV 90.4 88.8  --   --  90.3  --   --   --   PLT 341 240  --   --  252  --   --   --    < > = values in this interval not displayed.   Cardiac Enzymes: No results for input(s): "CKTOTAL", "CKMB", "CKMBINDEX", "TROPONINI" in the last 168 hours. BNP: Invalid input(s): "POCBNP" CBG: No results for input(s): "GLUCAP" in the last 168 hours. D-Dimer No results for input(s): "DDIMER" in the last 72 hours. Hgb A1c No results for input(s): "HGBA1C" in the last 72 hours. Lipid Profile No results for input(s): "CHOL", "HDL", "LDLCALC", "TRIG", "CHOLHDL", "LDLDIRECT"  in the last 72 hours. Thyroid function studies No results for input(s): "TSH", "T4TOTAL", "T3FREE", "THYROIDAB" in the last 72 hours.  Invalid input(s): "FREET3" Anemia work up Recent Labs    05/11/22 1348 05/12/22 0423  VITAMINB12  --  208  FOLATE 33.0  --   FERRITIN 28  --   TIBC 329  --   IRON 80  --   RETICCTPCT 2.5  --    Urinalysis No results found for: "COLORURINE", "APPEARANCEUR", "LABSPEC", "PHURINE", "GLUCOSEU", "HGBUR", "BILIRUBINUR", "KETONESUR", "PROTEINUR", "UROBILINOGEN", "NITRITE", "LEUKOCYTESUR" Sepsis Labs Recent Labs  Lab 05/11/22 1348 05/12/22 0423 05/13/22 0528  WBC 12.0* 7.6 7.4   Microbiology Recent Results (from the past 240 hour(s))  Gastrointestinal Panel by PCR , Stool     Status: Abnormal   Collection Time: 05/11/22 10:10 PM   Specimen: Stool  Result Value Ref Range Status   Campylobacter species NOT DETECTED NOT DETECTED Final   Plesimonas shigelloides NOT DETECTED NOT DETECTED Final   Salmonella species NOT DETECTED NOT DETECTED Final   Yersinia enterocolitica NOT DETECTED NOT DETECTED Final   Vibrio species NOT DETECTED NOT DETECTED Final   Vibrio cholerae NOT DETECTED NOT DETECTED Final   Enteroaggregative E coli (EAEC) NOT DETECTED NOT DETECTED Final   Enteropathogenic E coli (EPEC) DETECTED (A) NOT DETECTED Final    Comment: RESULT CALLED TO, READ BACK BY AND VERIFIED WITH: LOUANN HUMPRIES 05/12/2022 AT 0117 SRR    Enterotoxigenic E coli (ETEC) NOT DETECTED NOT DETECTED Final   Shiga like toxin producing E coli (STEC) NOT DETECTED NOT DETECTED Final   Shigella/Enteroinvasive E coli (EIEC) NOT DETECTED NOT DETECTED Final   Cryptosporidium NOT DETECTED NOT DETECTED Final   Cyclospora cayetanensis NOT DETECTED NOT DETECTED Final   Entamoeba histolytica NOT DETECTED NOT DETECTED Final   Giardia lamblia NOT DETECTED NOT DETECTED Final   Adenovirus F40/41 NOT DETECTED NOT DETECTED Final   Astrovirus NOT DETECTED NOT DETECTED Final    Norovirus GI/GII NOT  DETECTED NOT DETECTED Final   Rotavirus A NOT DETECTED NOT DETECTED Final   Sapovirus (I, II, IV, and V) NOT DETECTED NOT DETECTED Final    Comment: Performed at [facility], [place], [place] [address]  C Difficile Quick Screen w PCR reflex     Status: None   Collection Time: 05/11/22 10:10 PM   Specimen: STOOL  Result Value Ref Range Status   C Diff antigen NEGATIVE NEGATIVE Final   C Diff toxin NEGATIVE NEGATIVE Final   C Diff interpretation No C. difficile detected.  Final    Comment: Performed at [facility], [address] [place], [place] [address]     Time coordinating discharge: Over 30 minutes  SIGNED:   Duard Brady, MD  [facility] 05/14/2022, 10:42 AM Pager   If 7PM-7AM, please contact night-coverage

## 2022-05-14 NOTE — Discharge Instructions (Signed)
Advised to follow up PCP in one week. Advised to follow-up with GI Dr. Haig Prophet in 2 weeks for capsule endoscopy. Advised to take pantoprazole 40 mg every 12 hours for the next 3 months. Advised to take ciprofloxacin to 50 mg twice daily for 2 more days.

## 2022-05-14 NOTE — Telephone Encounter (Signed)
Called and left a message for call back  

## 2022-05-14 NOTE — Progress Notes (Signed)
Discharge instructions reviewed with patient including followup visits and new medications.  Understanding was verbalized and all questions were answered.  IV removed without complication; patient tolerated well.  Patient discharged home via wheelchair in stable condition escorted by volunteer staff.  

## 2022-05-14 NOTE — TOC CM/SW Note (Signed)
  Transition of Care [facility]) Screening Note   Patient Details  Name: Mary Bennett Date of Birth: 1950-12-16   Transition of Care [facility]) CM/SW Contact:    Gerilyn Pilgrim, LCSW Phone Number: 05/14/2022, 9:43 AM    Transition of Care Department [facility]) has reviewed patient and no TOC needs have been identified at this time. We will continue to monitor patient advancement through interdisciplinary progression rounds. If new patient transition needs arise, please place a TOC consult.

## 2022-05-14 NOTE — Telephone Encounter (Signed)
Patient return my call and got patient schedule for capsule study for 05/30/2022. Went over instructions with patient, mailed them and sent to [facility].

## 2022-05-16 LAB — SURGICAL PATHOLOGY

## 2022-05-23 ENCOUNTER — Telehealth: Payer: Self-pay

## 2022-05-23 NOTE — Telephone Encounter (Signed)
Patient is a establish patient with [facility] and patient is going to have capsule study with there office. Called patient and informed patient of this information and she verbalized understanding. Informed her that [facility] would reach to her about scheduling the procedure. Informed patient if she has not heard from them by tomorrow afternoon to call them to see if you can schedule the procedure. Patient asked what time you normal arrive for this procedure. Informed her usually around 7:30 and then she can go home and return the monitor around 3:30 or 4 that afternoon. Patient verbalized understanding and appreciated that I answered her questions.

## 2022-05-30 ENCOUNTER — Ambulatory Visit: Admit: 2022-05-30 | Payer: Medicare Other | Admitting: Gastroenterology

## 2022-05-30 SURGERY — IMAGING PROCEDURE, GI TRACT, INTRALUMINAL, VIA CAPSULE

## 2022-06-25 ENCOUNTER — Ambulatory Visit (INDEPENDENT_AMBULATORY_CARE_PROVIDER_SITE_OTHER): Payer: Medicare Other | Admitting: Dermatology

## 2022-06-25 VITALS — BP 126/73 | HR 85

## 2022-06-25 DIAGNOSIS — L578 Other skin changes due to chronic exposure to nonionizing radiation: Secondary | ICD-10-CM

## 2022-06-25 DIAGNOSIS — L814 Other melanin hyperpigmentation: Secondary | ICD-10-CM

## 2022-06-25 DIAGNOSIS — D225 Melanocytic nevi of trunk: Secondary | ICD-10-CM | POA: Diagnosis not present

## 2022-06-25 DIAGNOSIS — D229 Melanocytic nevi, unspecified: Secondary | ICD-10-CM

## 2022-06-25 DIAGNOSIS — Z1283 Encounter for screening for malignant neoplasm of skin: Secondary | ICD-10-CM

## 2022-06-25 DIAGNOSIS — Z86018 Personal history of other benign neoplasm: Secondary | ICD-10-CM

## 2022-06-25 DIAGNOSIS — D2239 Melanocytic nevi of other parts of face: Secondary | ICD-10-CM | POA: Diagnosis not present

## 2022-06-25 DIAGNOSIS — L821 Other seborrheic keratosis: Secondary | ICD-10-CM

## 2022-06-25 DIAGNOSIS — W57XXXA Bitten or stung by nonvenomous insect and other nonvenomous arthropods, initial encounter: Secondary | ICD-10-CM

## 2022-06-25 DIAGNOSIS — S20462A Insect bite (nonvenomous) of left back wall of thorax, initial encounter: Secondary | ICD-10-CM

## 2022-06-25 DIAGNOSIS — D1801 Hemangioma of skin and subcutaneous tissue: Secondary | ICD-10-CM

## 2022-06-25 NOTE — Progress Notes (Signed)
   Follow-Up Visit   Subjective  Mary Bennett is a 72 y.o. female who presents for the following: Skin Cancer Screening and Full Body Skin Exam, hx of Dysplastic nevus, patient would like a mole on her right cheek checked today. She feels like it might be a little bigger but she has had it for years  The patient presents for Total-Body Skin Exam (TBSE) for skin cancer screening and mole check. The patient has spots, moles and lesions to be evaluated, some may be new or changing and the patient has concerns that these could be cancer.    The following portions of the chart were reviewed this encounter and updated as appropriate: medications, allergies, medical history  Review of Systems:  No other skin or systemic complaints except as noted in HPI or Assessment and Plan.  Objective  Well appearing patient in no apparent distress; mood and affect are within normal limits.  A full examination was performed including scalp, head, eyes, ears, nose, lips, neck, chest, axillae, abdomen, back, buttocks, bilateral upper extremities, bilateral lower extremities, hands, feet, fingers, toes, fingernails, and toenails. All findings within normal limits unless otherwise noted below.   Relevant physical exam findings are noted in the Assessment and Plan.          Assessment & Plan   LENTIGINES, SEBORRHEIC KERATOSES, HEMANGIOMAS - Benign normal skin lesions - Benign-appearing - Call for any changes  NEVUS right temple; left chest; left mid sternum; left spinal mid back  Left chest: 6.54mm fleshy medium dark brown specked papule   Right Temple: 3.6mm brown macule, darker central, no changes    Left spinal mid back 4.0 x 3.67mm brown macule with a red macule adjacent, slightly irregular border, no changes   Left sternum  3.62mm speckled brown macule, no changes   Right nasolabial fold crease 4.0 mm flesh papule  Photo taken today   Benign-appearing.  Observation.  Call clinic for  new or changing moles.  Recommend daily use of broad spectrum spf 30+ sunscreen to sun-exposed areas.    ACTINIC DAMAGE - Chronic condition, secondary to cumulative UV/sun exposure - diffuse scaly erythematous macules with underlying dyspigmentation - Recommend daily broad spectrum sunscreen SPF 30+ to sun-exposed areas, reapply every 2 hours as needed.  - Staying in the shade or wearing long sleeves, sun glasses (UVA+UVB protection) and wide brim hats (4-inch brim around the entire circumference of the hat) are also recommended for sun protection.  - Call for new or changing lesions.  BITE REACTION  Left upper back  Pink edematous papule   Benign, observe.   Patient decline topical treatment   HISTORY OF DYSPLASTIC NEVUS No evidence of recurrence today Recommend regular full body skin exams Recommend daily broad spectrum sunscreen SPF 30+ to sun-exposed areas, reapply every 2 hours as needed.  Call if any new or changing lesions are noted between office visits   SKIN CANCER SCREENING PERFORMED TODAY.  Return in about 1 year (around 06/25/2023) for TBSE, hx of Dysplastic nevus .  I, Angelique Holm, CMA, am acting as scribe for Willeen Niece, MD .   Documentation: I have reviewed the above documentation for accuracy and completeness, and I agree with the above.  Willeen Niece, MD

## 2022-06-25 NOTE — Patient Instructions (Addendum)
Melanoma ABCDEs  Melanoma is the most dangerous type of skin cancer, and is the leading cause of death from skin disease.  You are more likely to develop melanoma if you: Have light-colored skin, light-colored eyes, or red or blond hair Spend a lot of time in the sun Tan regularly, either outdoors or in a tanning bed Have had blistering sunburns, especially during childhood Have a close family member who has had a melanoma Have atypical moles or large birthmarks  Early detection of melanoma is key since treatment is typically straightforward and cure rates are extremely high if we catch it early.   The first sign of melanoma is often a change in a mole or a new dark spot.  The ABCDE system is a way of remembering the signs of melanoma.  A for asymmetry:  The two halves do not match. B for border:  The edges of the growth are irregular. C for color:  A mixture of colors are present instead of an even brown color. D for diameter:  Melanomas are usually (but not always) greater than 6mm - the size of a pencil eraser. E for evolution:  The spot keeps changing in size, shape, and color.  Please check your skin once per month between visits. You can use a small mirror in front and a large mirror behind you to keep an eye on the back side or your body.   If you see any new or changing lesions before your next follow-up, please call to schedule a visit.  Please continue daily skin protection including broad spectrum sunscreen SPF 30+ to sun-exposed areas, reapplying every 2 hours as needed when you're outdoors.        Recommend daily broad spectrum sunscreen SPF 30+ to sun-exposed areas, reapply every 2 hours as needed. Call for new or changing lesions.  Staying in the shade or wearing long sleeves, sun glasses (UVA+UVB protection) and wide brim hats (4-inch brim around the entire circumference of the hat) are also recommended for sun protection.        Due to recent changes in  healthcare laws, you may see results of your pathology and/or laboratory studies on MyChart before the doctors have had a chance to review them. We understand that in some cases there may be results that are confusing or concerning to you. Please understand that not all results are received at the same time and often the doctors may need to interpret multiple results in order to provide you with the best plan of care or course of treatment. Therefore, we ask that you please give us 2 business days to thoroughly review all your results before contacting the office for clarification. Should we see a critical lab result, you will be contacted sooner.   If You Need Anything After Your Visit  If you have any questions or concerns for your doctor, please call our main line at [phone] and press option 4 to reach your doctor's medical assistant. If no one answers, please leave a voicemail as directed and we will return your call as soon as possible. Messages left after 4 pm will be answered the following business day.   You may also send us a message via MyChart. We typically respond to MyChart messages within 1-2 business days.  For prescription refills, please ask your pharmacy to contact our office. Our fax number is [phone].  If you have an urgent issue when the clinic is closed that cannot wait until the next business   day, you can page your doctor at the number below.    Please note that while we do our best to be available for urgent issues outside of office hours, we are not available 24/7.   If you have an urgent issue and are unable to reach us, you may choose to seek medical care at your doctor's office, retail clinic, urgent care center, or emergency room.  If you have a medical emergency, please immediately call 911 or go to the emergency department.  Pager Numbers  - Dr. Kowalski: [phone]  - Dr. Moye: [phone]  - Dr. Stewart: [phone]  In the event of inclement  weather, please call our main line at [phone] for an update on the status of any delays or closures.  Dermatology Medication Tips: Please keep the boxes that topical medications come in in order to help keep track of the instructions about where and how to use these. Pharmacies typically print the medication instructions only on the boxes and not directly on the medication tubes.   If your medication is too expensive, please contact our office at [phone] option 4 or send us a message through MyChart.   We are unable to tell what your co-pay for medications will be in advance as this is different depending on your insurance coverage. However, we may be able to find a substitute medication at lower cost or fill out paperwork to get insurance to cover a needed medication.   If a prior authorization is required to get your medication covered by your insurance company, please allow us 1-2 business days to complete this process.  Drug prices often vary depending on where the prescription is filled and some pharmacies may offer cheaper prices.  The website www.goodrx.com contains coupons for medications through different pharmacies. The prices here do not account for what the cost may be with help from insurance (it may be cheaper with your insurance), but the website can give you the price if you did not use any insurance.  - You can print the associated coupon and take it with your prescription to the pharmacy.  - You may also stop by our office during regular business hours and pick up a GoodRx coupon card.  - If you need your prescription sent electronically to a different pharmacy, notify our office through [facility] MyChart or by phone at [phone] option 4.     Si Usted Necesita Algo Despus de Su Visita  Tambin puede enviarnos un mensaje a travs de MyChart. Por lo general respondemos a los mensajes de MyChart en el transcurso de 1 a 2 das hbiles.  Para renovar recetas,  por favor pida a su farmacia que se ponga en contacto con nuestra oficina. Nuestro nmero de fax es el [phone].  Si tiene un asunto urgente cuando la clnica est cerrada y que no puede esperar hasta el siguiente da hbil, puede llamar/localizar a su doctor(a) al nmero que aparece a continuacin.   Por favor, tenga en cuenta que aunque hacemos todo lo posible para estar disponibles para asuntos urgentes fuera del horario de oficina, no estamos disponibles las 24 horas del da, los 7 das de la semana.   Si tiene un problema urgente y no puede comunicarse con nosotros, puede optar por buscar atencin mdica  en el consultorio de su doctor(a), en una clnica privada, en un centro de atencin urgente o en una sala de emergencias.  Si tiene una emergencia mdica, por favor llame inmediatamente al 911 o vaya   a la sala de emergencias.  Nmeros de bper  - Dr. Kowalski: [phone]  - Dra. Moye: [phone]  - Dra. Stewart: [phone]  En caso de inclemencias del tiempo, por favor llame a nuestra lnea principal al [phone] para una actualizacin sobre el estado de cualquier retraso o cierre.  Consejos para la medicacin en dermatologa: Por favor, guarde las cajas en las que vienen los medicamentos de uso tpico para ayudarle a seguir las instrucciones sobre dnde y cmo usarlos. Las farmacias generalmente imprimen las instrucciones del medicamento slo en las cajas y no directamente en los tubos del medicamento.   Si su medicamento es muy caro, por favor, pngase en contacto con nuestra oficina llamando al [phone] y presione la opcin 4 o envenos un mensaje a travs de MyChart.   No podemos decirle cul ser su copago por los medicamentos por adelantado ya que esto es diferente dependiendo de la cobertura de su seguro. Sin embargo, es posible que podamos encontrar un medicamento sustituto a menor costo o llenar un formulario para que el seguro cubra el medicamento que se  considera necesario.   Si se requiere una autorizacin previa para que su compaa de seguros cubra su medicamento, por favor permtanos de 1 a 2 das hbiles para completar este proceso.  Los precios de los medicamentos varan con frecuencia dependiendo del lugar de dnde se surte la receta y alguna farmacias pueden ofrecer precios ms baratos.  El sitio web www.goodrx.com tiene cupones para medicamentos de diferentes farmacias. Los precios aqu no tienen en cuenta lo que podra costar con la ayuda del seguro (puede ser ms barato con su seguro), pero el sitio web puede darle el precio si no utiliz ningn seguro.  - Puede imprimir el cupn correspondiente y llevarlo con su receta a la farmacia.  - Tambin puede pasar por nuestra oficina durante el horario de atencin regular y recoger una tarjeta de cupones de GoodRx.  - Si necesita que su receta se enve electrnicamente a una farmacia diferente, informe a nuestra oficina a travs de MyChart de [place] o por telfono llamando al [phone] y presione la opcin 4.  

## 2022-07-03 ENCOUNTER — Other Ambulatory Visit: Payer: Self-pay | Admitting: Gastroenterology

## 2022-07-03 DIAGNOSIS — D509 Iron deficiency anemia, unspecified: Secondary | ICD-10-CM

## 2022-07-18 ENCOUNTER — Other Ambulatory Visit: Payer: Self-pay | Admitting: Family Medicine

## 2022-07-18 DIAGNOSIS — I700 Atherosclerosis of aorta: Secondary | ICD-10-CM

## 2022-07-24 ENCOUNTER — Ambulatory Visit
Admission: RE | Admit: 2022-07-24 | Discharge: 2022-07-24 | Disposition: A | Discharge status: Home / Self Care | Payer: Medicare Other | Source: Ambulatory Visit | Attending: Family Medicine | Admitting: Family Medicine

## 2022-07-24 DIAGNOSIS — I700 Atherosclerosis of aorta: Secondary | ICD-10-CM | POA: Insufficient documentation

## 2022-07-26 ENCOUNTER — Other Ambulatory Visit: Payer: Self-pay | Admitting: Gastroenterology

## 2022-07-26 DIAGNOSIS — E611 Iron deficiency: Secondary | ICD-10-CM

## 2022-08-01 ENCOUNTER — Ambulatory Visit
Admission: RE | Admit: 2022-08-01 | Discharge: 2022-08-01 | Disposition: A | Discharge status: Home / Self Care | Payer: Medicare Other | Source: Ambulatory Visit | Attending: Gastroenterology | Admitting: Gastroenterology

## 2022-08-01 DIAGNOSIS — E611 Iron deficiency: Secondary | ICD-10-CM

## 2022-08-01 MED ORDER — IOPAMIDOL (ISOVUE-300) INJECTION 61%
100.0000 mL | Freq: Once | INTRAVENOUS | Status: AC | PRN
  Administered 2022-08-01: 100 mL via INTRAVENOUS

## 2022-08-30 ENCOUNTER — Other Ambulatory Visit: Payer: Self-pay | Admitting: Family Medicine

## 2022-08-30 DIAGNOSIS — Z1231 Encounter for screening mammogram for malignant neoplasm of breast: Secondary | ICD-10-CM

## 2022-09-12 ENCOUNTER — Ambulatory Visit
Admission: RE | Admit: 2022-09-12 | Discharge: 2022-09-12 | Disposition: A | Discharge status: Home / Self Care | Payer: Medicare Other | Source: Ambulatory Visit | Attending: Family Medicine | Admitting: Family Medicine

## 2022-09-12 DIAGNOSIS — Z1231 Encounter for screening mammogram for malignant neoplasm of breast: Secondary | ICD-10-CM

## 2022-12-24 IMAGING — MG MM DIGITAL SCREENING BILAT W/ TOMO AND CAD
6 of 10 series · 6 of 30 positions shown · non-contrast
Comparison: Previous exam(s).

CLINICAL DATA: Screening.

EXAM:
DIGITAL SCREENING BILATERAL MAMMOGRAM WITH TOMOSYNTHESIS AND CAD
TECHNIQUE: Bilateral screening digital craniocaudal and mediolateral oblique
mammograms were obtained. Bilateral screening digital breast
tomosynthesis was performed. The images were evaluated with
computer-aided detection.

[L CC synth-2D (1 of 2)]
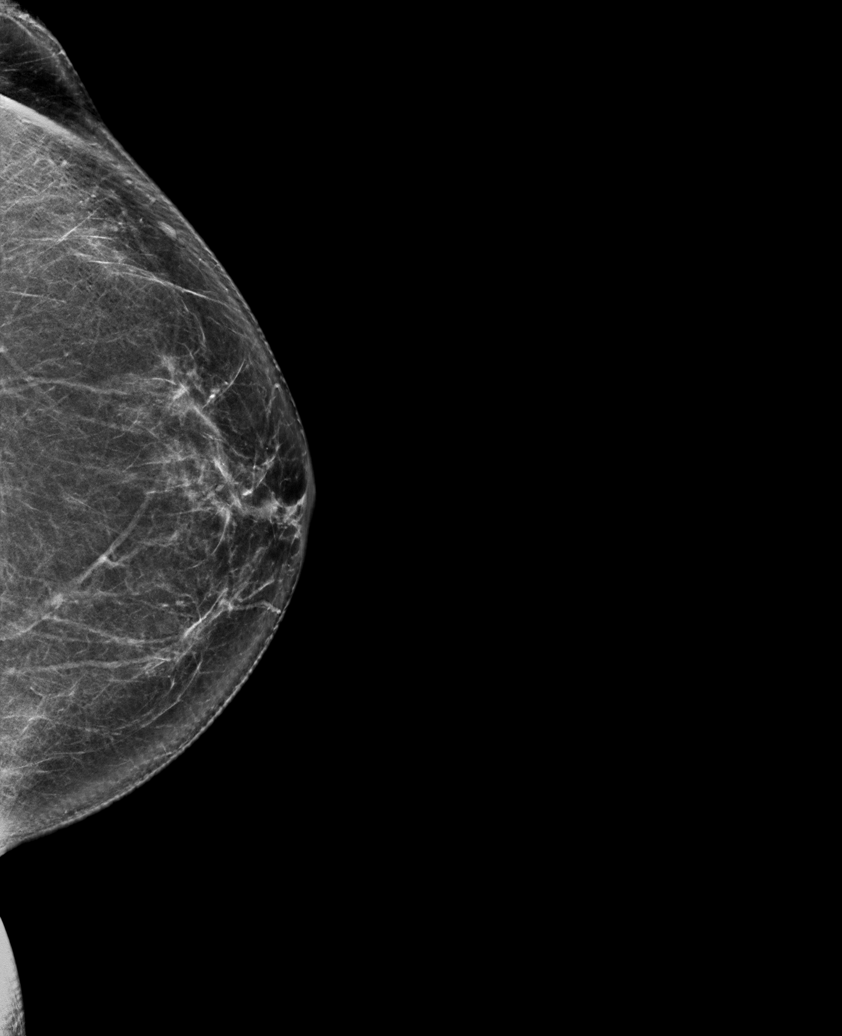

[R MLO synth-2D]
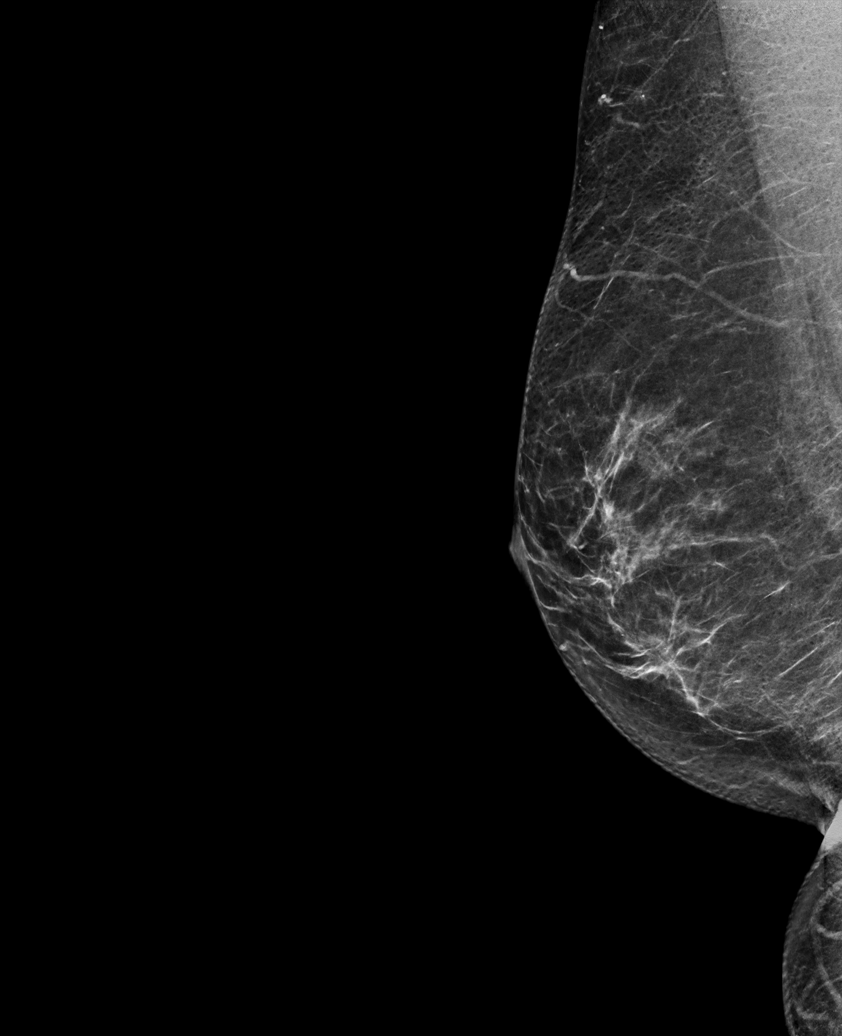

[L MLO synth-2D]
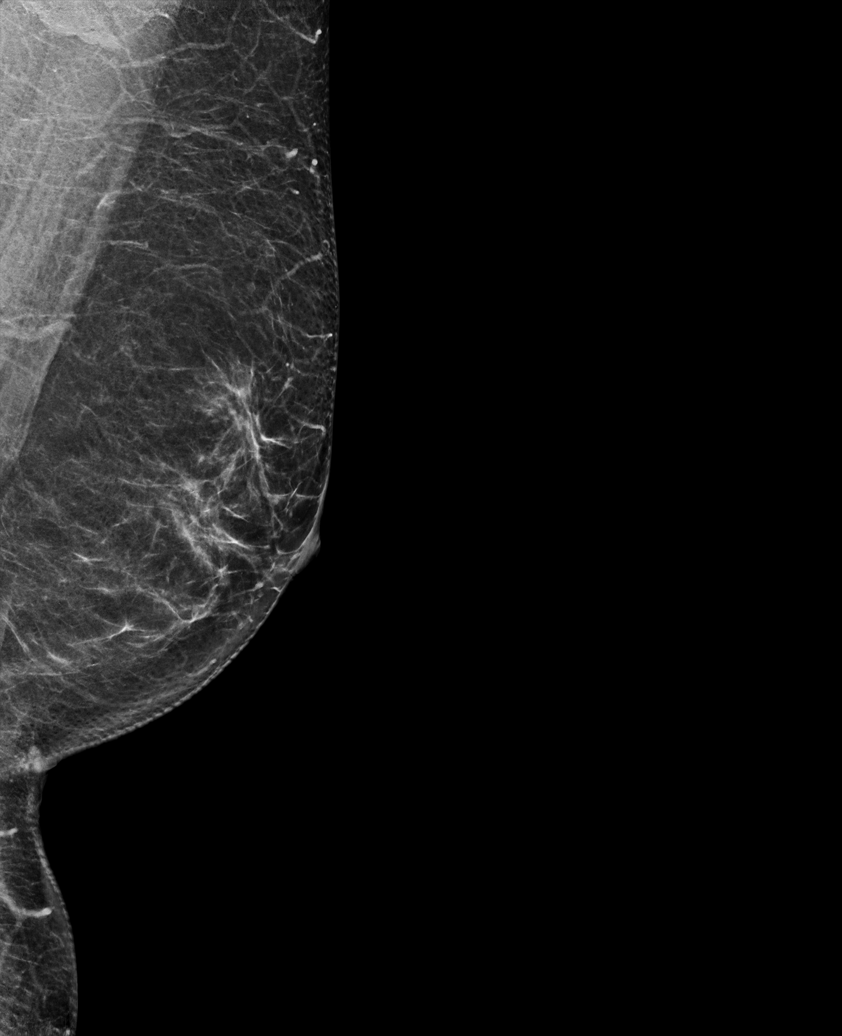

[L CC synth-2D (2 of 2)]
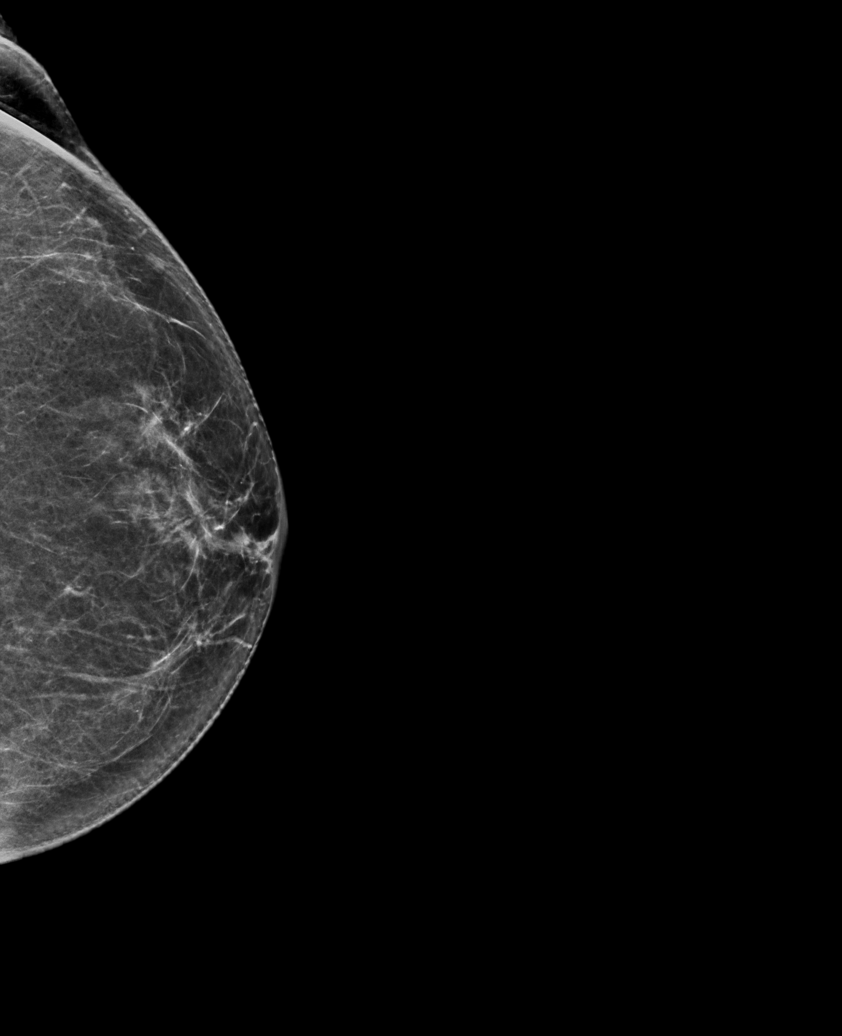

[R CC synth-2D]
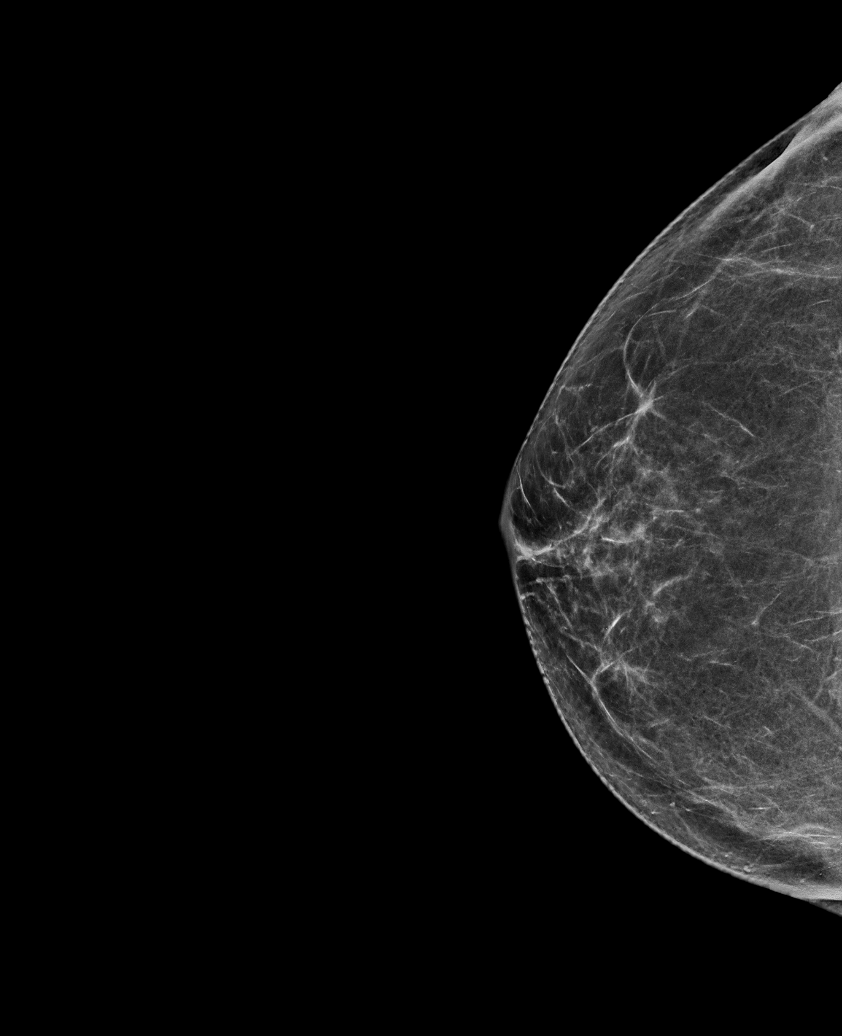

[L CC tomo · tomo slice 36/71.0]
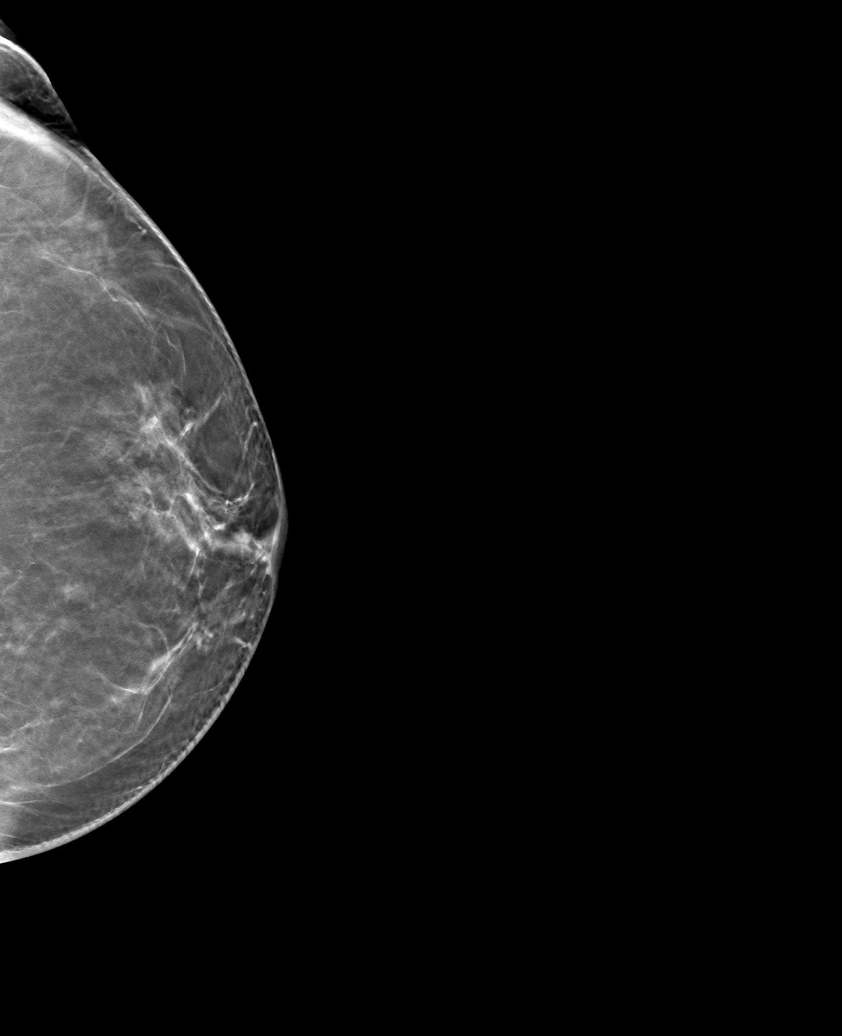

[6 of 30 positions shown; findings below may reference images not displayed]

ACR Breast Density Category b: There are scattered areas of
fibroglandular density.
FINDINGS: There are no findings suspicious for malignancy.
IMPRESSION: No mammographic evidence of malignancy. A result letter of this
screening mammogram will be mailed directly to the patient.

RECOMMENDATION:
Screening mammogram in one year. (Code:51-O-LD2)

BI-RADS CATEGORY  1: Negative.

## 2023-05-18 ENCOUNTER — Other Ambulatory Visit: Payer: Self-pay | Admitting: Dermatology

## 2023-05-18 DIAGNOSIS — L304 Erythema intertrigo: Secondary | ICD-10-CM

## 2023-07-01 ENCOUNTER — Ambulatory Visit (INDEPENDENT_AMBULATORY_CARE_PROVIDER_SITE_OTHER): Payer: Medicare Other | Admitting: Dermatology

## 2023-07-01 DIAGNOSIS — Z1283 Encounter for screening for malignant neoplasm of skin: Secondary | ICD-10-CM

## 2023-07-01 DIAGNOSIS — L578 Other skin changes due to chronic exposure to nonionizing radiation: Secondary | ICD-10-CM | POA: Diagnosis not present

## 2023-07-01 DIAGNOSIS — L821 Other seborrheic keratosis: Secondary | ICD-10-CM

## 2023-07-01 DIAGNOSIS — D2271 Melanocytic nevi of right lower limb, including hip: Secondary | ICD-10-CM

## 2023-07-01 DIAGNOSIS — W908XXA Exposure to other nonionizing radiation, initial encounter: Secondary | ICD-10-CM | POA: Diagnosis not present

## 2023-07-01 DIAGNOSIS — D225 Melanocytic nevi of trunk: Secondary | ICD-10-CM

## 2023-07-01 DIAGNOSIS — D2239 Melanocytic nevi of other parts of face: Secondary | ICD-10-CM

## 2023-07-01 DIAGNOSIS — L814 Other melanin hyperpigmentation: Secondary | ICD-10-CM

## 2023-07-01 DIAGNOSIS — Z86018 Personal history of other benign neoplasm: Secondary | ICD-10-CM

## 2023-07-01 DIAGNOSIS — D2272 Melanocytic nevi of left lower limb, including hip: Secondary | ICD-10-CM

## 2023-07-01 DIAGNOSIS — L738 Other specified follicular disorders: Secondary | ICD-10-CM

## 2023-07-01 DIAGNOSIS — D229 Melanocytic nevi, unspecified: Secondary | ICD-10-CM

## 2023-07-01 DIAGNOSIS — D1801 Hemangioma of skin and subcutaneous tissue: Secondary | ICD-10-CM

## 2023-07-01 NOTE — Patient Instructions (Addendum)
 Seborrheic Keratosis  What causes seborrheic keratoses? Seborrheic keratoses are harmless, common skin growths that first appear during adult life.  As time goes by, more growths appear.  Some people may develop a large number of them.  Seborrheic keratoses appear on both covered and uncovered body parts.  They are not caused by sunlight.  The tendency to develop seborrheic keratoses can be inherited.  They vary in color from skin-colored to gray, brown, or even black.  They can be either smooth or have a rough, warty surface.   Seborrheic keratoses are superficial and look as if they were stuck on the skin.  Under the microscope this type of keratosis looks like layers upon layers of skin.  That is why at times the top layer may seem to fall off, but the rest of the growth remains and re-grows.    Treatment Seborrheic keratoses do not need to be treated, but can easily be removed in the office.  Seborrheic keratoses often cause symptoms when they rub on clothing or jewelry.  Lesions can be in the way of shaving.  If they become inflamed, they can cause itching, soreness, or burning.  Removal of a seborrheic keratosis can be accomplished by freezing, burning, or surgery. If any spot bleeds, scabs, or grows rapidly, please return to have it checked, as these can be an indication of a skin cancer.   Melanoma ABCDEs  Melanoma is the most dangerous type of skin cancer, and is the leading cause of death from skin disease.  You are more likely to develop melanoma if you: Have light-colored skin, light-colored eyes, or red or blond hair Spend a lot of time in the sun Tan regularly, either outdoors or in a tanning bed Have had blistering sunburns, especially during childhood Have a close family member who has had a melanoma Have atypical moles or large birthmarks  Early detection of melanoma is key since treatment is typically straightforward and cure rates are extremely high if we catch it early.    The first sign of melanoma is often a change in a mole or a new dark spot.  The ABCDE system is a way of remembering the signs of melanoma.  A for asymmetry:  The two halves do not match. B for border:  The edges of the growth are irregular. C for color:  A mixture of colors are present instead of an even brown color. D for diameter:  Melanomas are usually (but not always) greater than 6mm - the size of a pencil eraser. E for evolution:  The spot keeps changing in size, shape, and color.  Please check your skin once per month between visits. You can use a small mirror in front and a large mirror behind you to keep an eye on the back side or your body.   If you see any new or changing lesions before your next follow-up, please call to schedule a visit.  Please continue daily skin protection including broad spectrum sunscreen SPF 30+ to sun-exposed areas, reapplying every 2 hours as needed when you're outdoors.   Staying in the shade or wearing long sleeves, sun glasses (UVA+UVB protection) and wide brim hats (4-inch brim around the entire circumference of the hat) are also recommended for sun protection.    Due to recent changes in healthcare laws, you may see results of your pathology and/or laboratory studies on MyChart before the doctors have had a chance to review them. We understand that in some cases there may  be results that are confusing or concerning to you. Please understand that not all results are received at the same time and often the doctors may need to interpret multiple results in order to provide you with the best plan of care or course of treatment. Therefore, we ask that you please give [place] 2 business days to thoroughly review all your results before contacting the office for clarification. Should we see a critical lab result, you will be contacted sooner.   If You Need Anything After Your Visit  If you have any questions or concerns for your doctor, please call our main  line at [phone] and press option 4 to reach your doctor's medical assistant. If no one answers, please leave a voicemail as directed and we will return your call as soon as possible. Messages left after 4 pm will be answered the following business day.   You may also send [place] a message via MyChart. We typically respond to MyChart messages within 1-2 business days.  For prescription refills, please ask your pharmacy to contact our office. Our fax number is [phone].  If you have an urgent issue when the clinic is closed that cannot wait until the next business day, you can page your doctor at the number below.    Please note that while we do our best to be available for urgent issues outside of office hours, we are not available 24/7.   If you have an urgent issue and are unable to reach [place], you may choose to seek medical care at your doctor's office, retail clinic, urgent care center, or emergency room.  If you have a medical emergency, please immediately call 911 or go to the emergency department.  Pager Numbers  - Dr. Gwen Pounds: [phone]  - Dr. Roseanne Reno: [phone]  - Dr. Katrinka Blazing: [phone]   In the event of inclement weather, please call our main line at [phone] for an update on the status of any delays or closures.  Dermatology Medication Tips: Please keep the boxes that topical medications come in in order to help keep track of the instructions about where and how to use these. Pharmacies typically print the medication instructions only on the boxes and not directly on the medication tubes.   If your medication is too expensive, please contact our office at [phone] option 4 or send [place] a message through MyChart.   We are unable to tell what your co-pay for medications will be in advance as this is different depending on your insurance coverage. However, we may be able to find a substitute medication at lower cost or fill out paperwork to get insurance to cover  a needed medication.   If a prior authorization is required to get your medication covered by your insurance company, please allow [place] 1-2 business days to complete this process.  Drug prices often vary depending on where the prescription is filled and some pharmacies may offer cheaper prices.  The website www.goodrx.com contains coupons for medications through different pharmacies. The prices here do not account for what the cost may be with help from insurance (it may be cheaper with your insurance), but the website can give you the price if you did not use any insurance.  - You can print the associated coupon and take it with your prescription to the pharmacy.  - You may also stop by our office during regular business hours and pick up a GoodRx coupon card.  - If you need your prescription sent electronically to  a different pharmacy, notify our office through [facility] or by phone at [phone] option 4.     Si Usted Necesita Algo Despus de Su Visita  Tambin puede enviarnos un mensaje a travs de Clinical cytogeneticist. Por lo general respondemos a los mensajes de MyChart en el transcurso de 1 a 2 das hbiles.  Para renovar recetas, por favor pida a su farmacia que se ponga en contacto con nuestra oficina. Annie Sable de fax es [place] [phone].  Si tiene un asunto urgente cuando la clnica est cerrada y que no puede esperar hasta el siguiente da hbil, puede llamar/localizar a su doctor(a) al nmero que aparece a continuacin.   Por favor, tenga en cuenta que aunque hacemos todo lo posible para estar disponibles para asuntos urgentes fuera del horario de [place], no estamos disponibles las 24 horas del da, los 7 [address]  [address] de la [place].   Si tiene un problema urgente y no puede comunicarse con nosotros, puede optar por buscar atencin mdica  en el consultorio de su doctor(a), en una clnica privada, en un centro de atencin urgente o en una sala de emergencias.  Si tiene Psychologist, clinical, por favor llame inmediatamente al 911 o vaya a la sala de emergencias.  Nmeros de bper  - Dr. Gwen Pounds: [phone]  - Dra. Roseanne Reno: [phone]  - Dr. Katrinka Blazing: [phone]   En caso de inclemencias del tiempo, por favor llame a Lacy Duverney principal al [phone] para una actualizacin sobre el [place] de cualquier retraso o cierre.  Consejos para la medicacin en dermatologa: Por favor, guarde las cajas en las que vienen los medicamentos de uso tpico para ayudarle a seguir las instrucciones sobre dnde y cmo usarlos. Las farmacias generalmente imprimen las instrucciones del medicamento slo en las cajas y no directamente en los tubos del [place].   Si su medicamento es muy caro, por favor, pngase en contacto con Rolm Gala llamando al [phone] y presione la opcin 4 o envenos un mensaje a travs de Clinical cytogeneticist.   No podemos decirle cul ser su copago por los medicamentos por adelantado ya que esto es diferente dependiendo de la cobertura de su seguro. Sin embargo, es posible que podamos encontrar un medicamento sustituto a Audiological scientist un formulario para que el seguro cubra el medicamento que se considera necesario.   Si se requiere una autorizacin previa para que su compaa de seguros [place] su medicamento, por favor permtanos de 1 a 2 das hbiles para completar [address]  Los precios de los medicamentos varan con frecuencia dependiendo del Environmental consultant de dnde se surte la receta y alguna farmacias pueden ofrecer precios ms baratos.  El sitio web www.goodrx.com tiene cupones para medicamentos de Health and safety inspector. Los precios aqu no tienen en cuenta lo que podra costar con la ayuda del seguro (puede ser ms barato con su seguro), pero el sitio web puede darle el precio si no utiliz Tourist information centre manager.  - Puede imprimir el cupn correspondiente y llevarlo con su receta a la farmacia.  - Tambin puede pasar por nuestra oficina durante el horario de  atencin regular y Education officer, museum una tarjeta de cupones de GoodRx.  - Si necesita que su receta se enve electrnicamente a una farmacia diferente, informe a nuestra oficina a travs de MyChart de [place] o por telfono llamando al [phone] y presione la opcin 4.

## 2023-07-01 NOTE — Progress Notes (Signed)
   Follow-Up Visit   Subjective  Mary Bennett is a 73 y.o. female who presents for the following: Skin Cancer Screening and Full Body Skin Exam  The patient presents for Total-Body Skin Exam (TBSE) for skin cancer screening and mole check. The patient has spots, moles and lesions to be evaluated, some may be new or changing and the patient may have concern these could be cancer.    The following portions of the chart were reviewed this encounter and updated as appropriate: medications, allergies, medical history  Review of Systems:  No other skin or systemic complaints except as noted in HPI or Assessment and Plan.  Objective  Well appearing patient in no apparent distress; mood and affect are within normal limits.  A full examination was performed including scalp, head, eyes, ears, nose, lips, neck, chest, axillae, abdomen, back, buttocks, bilateral upper extremities, bilateral lower extremities, hands, feet, fingers, toes, fingernails, and toenails. All findings within normal limits unless otherwise noted below.   Relevant physical exam findings are noted in the Assessment and Plan.    Assessment & Plan   SKIN CANCER SCREENING PERFORMED TODAY.  ACTINIC DAMAGE - Chronic condition, secondary to cumulative UV/sun exposure - diffuse scaly erythematous macules with underlying dyspigmentation - Recommend daily broad spectrum sunscreen SPF 30+ to sun-exposed areas, reapply every 2 hours as needed.  - Staying in the shade or wearing long sleeves, sun glasses (UVA+UVB protection) and wide brim hats (4-inch brim around the entire circumference of the hat) are also recommended for sun protection.  - Call for new or changing lesions.  LENTIGINES, SEBORRHEIC KERATOSES, HEMANGIOMAS - Benign normal skin lesions - Benign-appearing - Call for any changes  MELANOCYTIC NEVI - Tan-brown and/or pink-flesh-colored symmetric macules and papules Left chest: 6.71mm fleshy medium dark brown  specked papule   Right Temple: 3.61mm brown macule, darker central, no changes    Left spinal mid upper back: 4.0 x 3.6mm brown macule with a red macule adjacent, slightly irregular border, no changes   Left sternum:  4 mm speckled brown macule, no changes compared to photo 06/25/2022   Right nasolabial fold crease: 4.0 mm flesh papule  Photo compared 06/25/2022   Right calf: 2 mm med dark brown macule  Left pretibia: 2 mm med dark brown papule  - Benign appearing on exam today - Observation - Call clinic for new or changing moles - Recommend daily use of broad spectrum spf 30+ sunscreen to sun-exposed areas.   History of Dysplastic Nevi - No evidence of recurrence today - Recommend regular full body skin exams - Recommend daily broad spectrum sunscreen SPF 30+ to sun-exposed areas, reapply every 2 hours as needed.  - Call if any new or changing lesions are noted between office visits  Sebaceous Hyperplasia - Small yellow papules with a central dell - Benign-appearing - Observe. Call for changes.  Return in about 1 year (around 06/30/2024) for TBSE, Hx Dysplastic Nevus.  IBernardine Bridegroom, CMA, am acting as scribe for Artemio Larry, MD .   Documentation: I have reviewed the above documentation for accuracy and completeness, and I agree with the above.  Artemio Larry, MD

## 2023-09-23 ENCOUNTER — Other Ambulatory Visit: Payer: Self-pay | Admitting: Family Medicine

## 2023-09-23 DIAGNOSIS — Z1231 Encounter for screening mammogram for malignant neoplasm of breast: Secondary | ICD-10-CM

## 2023-10-09 ENCOUNTER — Ambulatory Visit
Admission: RE | Admit: 2023-10-09 | Discharge: 2023-10-09 | Disposition: A | Discharge status: Home / Self Care | Source: Ambulatory Visit | Attending: Family Medicine | Admitting: Family Medicine

## 2023-10-09 DIAGNOSIS — Z1231 Encounter for screening mammogram for malignant neoplasm of breast: Secondary | ICD-10-CM | POA: Diagnosis present

## 2024-07-28 ENCOUNTER — Ambulatory Visit: Admitting: Dermatology
